# Patient Record
Sex: Female | Born: 1970 | Race: White | Hispanic: No | Smoking: Never smoker
Health system: Southern US, Community
[De-identification: ages and names within clinical notes are randomized; demographics above are authoritative.]

## PROBLEM LIST (undated history)

## (undated) DIAGNOSIS — E039 Hypothyroidism, unspecified: Secondary | ICD-10-CM

## (undated) DIAGNOSIS — O039 Complete or unspecified spontaneous abortion without complication: Secondary | ICD-10-CM

## (undated) DIAGNOSIS — E079 Disorder of thyroid, unspecified: Secondary | ICD-10-CM

## (undated) DIAGNOSIS — J302 Other seasonal allergic rhinitis: Secondary | ICD-10-CM

## (undated) HISTORY — DX: Other seasonal allergic rhinitis: J30.2

## (undated) HISTORY — PX: TONSILLECTOMY: SUR1361

---

## 2006-02-18 ENCOUNTER — Ambulatory Visit: Payer: Self-pay | Admitting: Obstetrics and Gynecology

## 2007-04-22 ENCOUNTER — Ambulatory Visit: Payer: Self-pay | Admitting: Internal Medicine

## 2010-04-14 ENCOUNTER — Emergency Department: Payer: Self-pay | Admitting: Emergency Medicine

## 2010-04-14 ENCOUNTER — Inpatient Hospital Stay: Payer: Self-pay | Admitting: Internal Medicine

## 2010-04-22 ENCOUNTER — Ambulatory Visit: Payer: Self-pay | Admitting: Internal Medicine

## 2010-06-24 ENCOUNTER — Ambulatory Visit: Payer: Self-pay | Admitting: Otolaryngology

## 2010-12-25 ENCOUNTER — Ambulatory Visit: Payer: Self-pay | Admitting: Obstetrics and Gynecology

## 2010-12-26 ENCOUNTER — Ambulatory Visit: Payer: Self-pay | Admitting: Obstetrics and Gynecology

## 2010-12-29 LAB — PATHOLOGY REPORT

## 2011-07-01 ENCOUNTER — Ambulatory Visit: Payer: Self-pay | Admitting: Obstetrics and Gynecology

## 2012-03-02 ENCOUNTER — Ambulatory Visit: Payer: Self-pay | Admitting: Obstetrics and Gynecology

## 2012-11-14 ENCOUNTER — Inpatient Hospital Stay: Payer: Self-pay

## 2012-11-14 LAB — URINALYSIS, COMPLETE
Nitrite: NEGATIVE
Ph: 6 (ref 4.5–8.0)
Protein: NEGATIVE
RBC,UR: 3 /HPF (ref 0–5)
Squamous Epithelial: <1

## 2012-11-14 LAB — CBC WITH DIFFERENTIAL/PLATELET
Basophil #: 0 10*3/uL (ref 0.0–0.1)
Eosinophil %: 0.1 %
Lymphocyte #: 1.6 10*3/uL (ref 1.0–3.6)
Lymphocyte %: 12.7 %
Monocyte #: 0.4 x10 3/mm (ref 0.2–0.9)
Monocyte %: 3.5 %
Neutrophil %: 83.4 %
Platelet: 206 10*3/uL (ref 150–440)

## 2012-11-15 LAB — HEMATOCRIT: HCT: 26.6 % — ABNORMAL LOW (ref 35.0–47.0)

## 2012-11-16 LAB — URINE CULTURE

## 2013-01-23 ENCOUNTER — Ambulatory Visit: Payer: Self-pay | Admitting: Obstetrics and Gynecology

## 2013-01-23 LAB — URINALYSIS, COMPLETE
Bilirubin,UR: NEGATIVE
Ph: 5 (ref 4.5–8.0)
Protein: NEGATIVE
Specific Gravity: 1.018 (ref 1.003–1.030)

## 2013-01-23 LAB — HEMOGLOBIN: HGB: 12.2 g/dL (ref 12.0–16.0)

## 2013-01-24 LAB — PATHOLOGY REPORT

## 2013-03-29 ENCOUNTER — Ambulatory Visit: Payer: Self-pay | Admitting: Podiatry

## 2013-05-30 ENCOUNTER — Ambulatory Visit: Payer: Self-pay | Admitting: Obstetrics and Gynecology

## 2013-06-08 ENCOUNTER — Ambulatory Visit: Payer: Self-pay | Admitting: Obstetrics and Gynecology

## 2013-10-18 DIAGNOSIS — M545 Low back pain, unspecified: Secondary | ICD-10-CM | POA: Insufficient documentation

## 2013-10-18 DIAGNOSIS — R51 Headache: Secondary | ICD-10-CM | POA: Insufficient documentation

## 2013-10-18 DIAGNOSIS — R519 Headache, unspecified: Secondary | ICD-10-CM | POA: Insufficient documentation

## 2013-10-18 DIAGNOSIS — E7800 Pure hypercholesterolemia, unspecified: Secondary | ICD-10-CM | POA: Insufficient documentation

## 2014-01-02 ENCOUNTER — Ambulatory Visit: Payer: Self-pay | Admitting: Obstetrics and Gynecology

## 2014-02-04 DIAGNOSIS — D510 Vitamin B12 deficiency anemia due to intrinsic factor deficiency: Secondary | ICD-10-CM | POA: Insufficient documentation

## 2014-02-04 DIAGNOSIS — G43909 Migraine, unspecified, not intractable, without status migrainosus: Secondary | ICD-10-CM | POA: Insufficient documentation

## 2014-02-04 DIAGNOSIS — Z8659 Personal history of other mental and behavioral disorders: Secondary | ICD-10-CM | POA: Insufficient documentation

## 2014-02-04 DIAGNOSIS — E039 Hypothyroidism, unspecified: Secondary | ICD-10-CM | POA: Insufficient documentation

## 2014-06-13 DIAGNOSIS — J309 Allergic rhinitis, unspecified: Secondary | ICD-10-CM | POA: Insufficient documentation

## 2014-07-17 ENCOUNTER — Ambulatory Visit: Payer: Self-pay | Admitting: Obstetrics and Gynecology

## 2014-11-09 NOTE — Op Note (Signed)
PATIENT NAME:  Tracy Gillespie, [place] MR#:  [id number] DATE OF BIRTH:  [birth year]  DATE OF PROCEDURE:  01/23/2013  PREOPERATIVE DIAGNOSES:  1.  Nine weeks postpartum. 2.  Retained products of conception.   POSTOPERATIVE DIAGNOSES:  1.  Nine weeks postpartum. 2.  Retained products of conception.   PROCEDURE: Suction D and C.   SURGEON: Ricky L. Logan BoresEvans, M.D.   ANESTHESIA: MAC.   FINDINGS: Dilated cervix, degenerating-appearing tissue, probable placental lobule. Excellent hemostasis and cosmesis.   ESTIMATED BLOOD LOSS: Minimal.   COMPLICATIONS: None.   SPECIMENS: Retained products of conception, grossly consistent with placental lobule.   PROCEDURE IN DETAIL: The patient was given 2 grams of Ancef preoperatively, taken to the Operating Room and placed in the supine position. Anesthesia was initiated. She was then placed in the dorsal lithotomy position using Allen stirrups, prepped and draped in the usual sterile fashion. The cervix was visualized. It was already dilated and allowed admission of polyp forceps. Polyp forceps was used to tease free tissue that measured approximately 8 x 1.5 cm, somewhat cylindrical in shape with degenerative changes.   Gentle sharp curettage was carried out, which showed what appeared to be good uterine cry throughout. A #8 suction curette was place with minimal return of fluid. At this point, I did one more gentle scrape with sharp curette, which again showed good uterine cry. At this point, the procedure was felt to have achieved  maximum efficacy. Instruments were removed. The cervix was made hemostatic with silver nitrate. The bladder was drained. The patient tolerated the procedure well.   All instrument, needle and sponge counts were correct. Anticipate routine postoperative course.    ____________________________ Reatha Harpsicky L. Logan BoresEvans, MD rle:jm D: 01/23/2013 10:40:09 ET T: 01/23/2013 14:37:44 ET JOB#: [id number]  cc: Ricky L. Logan BoresEvans, MD, <Dictator> Augustina MoodICK L  Sumaiyah Markert MD ELECTRONICALLY SIGNED 01/25/2013 12:15

## 2014-11-27 NOTE — H&P (Signed)
L&D Evaluation:  History:  HPI 44 y/o G2P0010 @ 38wks EDC 11/28/12 arrives with c/o regular contractions 0310 this am, denies leaking fluid or vaginal bleeding, baby is active.Care @ [facility] well pregnancy, AMA, Hashimotos HX twin pregnancy-no development of one sac/embryo. GBS negative   Presents with contractions   Patient's Medical History Thyroid Disease   Patient's Surgical History none   Medications Pre Natal Vitamins  levothyroxiine 25mcg QD   Allergies NKDA   Social History none   Family History Non-Contributory   ROS:  ROS All systems were reviewed.  HEENT, CNS, GI, GU, Respiratory, CV, Renal and Musculoskeletal systems were found to be normal.   Exam:  Vital Signs stable   Urine Protein not completed   General no apparent distress   Mental Status clear   Chest clear   Heart normal sinus rhythm   Abdomen gravid, non-tender   Estimated Fetal Weight Average for gestational age   Fetal Position vtx   Fundal Height term   Back no CVAT   Edema 1+   Reflexes 2+   Clonus negative   Pelvic no external lesions, 7-8cm 100% vtx @ -1 BBOW   Mebranes Intact   FHT normal rate with no decels, baseline 130s avg variability with accels   Fetal Heart Rate 136   Ucx regular, Q 2/3 mins 60 sec strong   Skin dry   Lymph no lymphadenopathy   Impression:  Impression active labor   Plan:  Plan monitor contractions and for cervical change   Comments Breathing through uc's well, VSS AF FHR reactive. Discussed plan of care, what to expect with first baby. DC pain management options including position changes for natural childbirth, IV stadol and epidural. Pt prefers natural childbirth for now, may request stadol. Husband at bedside, supportive.   Electronic Signatures: Albertina ParrLugiano, Laiah Pouncey B (CNM)  (Signed 28-Apr-14 08:40)  Authored: L&D Evaluation   Last Updated: 28-Apr-14 08:40 by Albertina ParrLugiano, Maor Meckel B (CNM)

## 2015-01-29 ENCOUNTER — Other Ambulatory Visit: Payer: Self-pay | Admitting: Obstetrics and Gynecology

## 2015-01-29 DIAGNOSIS — N979 Female infertility, unspecified: Secondary | ICD-10-CM

## 2015-02-08 ENCOUNTER — Encounter: Payer: Self-pay | Admitting: Obstetrics and Gynecology

## 2015-02-08 ENCOUNTER — Ambulatory Visit
Admission: RE | Admit: 2015-02-08 | Discharge: 2015-02-08 | Disposition: A | Discharge status: Home / Self Care | Payer: BC Managed Care – PPO | Source: Ambulatory Visit | Attending: Obstetrics and Gynecology | Admitting: Obstetrics and Gynecology

## 2015-02-08 DIAGNOSIS — N971 Female infertility of tubal origin: Secondary | ICD-10-CM | POA: Diagnosis not present

## 2015-02-08 DIAGNOSIS — N979 Female infertility, unspecified: Secondary | ICD-10-CM | POA: Diagnosis present

## 2015-02-08 MED ORDER — IOHEXOL 300 MG/ML  SOLN: 50.0000 mL | Freq: Once | INTRAMUSCULAR | Status: AC | PRN

## 2015-02-08 NOTE — Progress Notes (Signed)
Patient ID: Tracy Gillespie, female   DOB: 02/10/1971, 43 y.o.   MRN: [id number]   Hysterosalpingogram Procedure Note  Date of procedure: 02/08/2015   Pre-operative Diagnosis: Infertility  Post-operative Diagnosis: same  Procedure: Hysterosalpingogram  Surgeon: Kenya Shiraishi E. Oveda Dadamo, MD  Assistant(s):  Radiology assistant. The radiologist present for today read the imaging and agreed with findings below.  Anesthesia: None  Estimated Blood Loss: None  Contrast used:  300 Omnipaque         Complications:  None; patient tolerated the procedure well.         Disposition: To home         Condition: stable  Findings: Bilateral fill and spill of the tubes and a normal endometrial contour was noted.  Procedure Details  HSG procedure discussed with the patient.  Risks, complications, alternatives have been reviewed with her and she agrees to proceed.   The patient presented to the radiology lab and was identified as the correct patient and the procedure verified as an HSG. A verbal Time Out was held with all team members present and in agreement.  Speculum was inserted in to the vagina and the cervix visualized.  The cervix was cleaned with betadine solution. The HSG catheter was inserted and the balloon insufflated with approximately 1.5 ml of air.  Patient was then repositioned for fluoroscopy.  A total of 15 ml of contrast was used for the procedure. The patient tolerated the procedure well, no complications.   Bilateral fill and spill of the tubes and a normal endometrial contour was noted.  Results were reviewed with the patient at the time of the procedure. She verbalized understanding.   Gergory Biello, MD 02/08/2015   

## 2015-02-08 NOTE — Op Note (Signed)
Patient ID: Tracy Gillespie, female   DOB: Oct 01, 1970, 44 y.o.   MRN: [id number]   Hysterosalpingogram Procedure Note  Date of procedure: 02/08/2015   Pre-operative Diagnosis: Infertility  Post-operative Diagnosis: same  Procedure: Hysterosalpingogram  Surgeon: Cline Cools, MD  Assistant(s):  Radiology assistant. The radiologist present for today read the imaging and agreed with findings below.  Anesthesia: None  Estimated Blood Loss: None  Contrast used:  300 Omnipaque         Complications:  None; patient tolerated the procedure well.         Disposition: To home         Condition: stable  Findings: Bilateral fill and spill of the tubes and a normal endometrial contour was noted.  Procedure Details  HSG procedure discussed with the patient.  Risks, complications, alternatives have been reviewed with her and she agrees to proceed.   The patient presented to the radiology lab and was identified as the correct patient and the procedure verified as an HSG. A verbal Time Out was held with all team members present and in agreement.  Speculum was inserted in to the vagina and the cervix visualized.  The cervix was cleaned with betadine solution. The HSG catheter was inserted and the balloon insufflated with approximately 1.5 ml of air.  Patient was then repositioned for fluoroscopy.  A total of 15 ml of contrast was used for the procedure. The patient tolerated the procedure well, no complications.   Bilateral fill and spill of the tubes and a normal endometrial contour was noted.  Results were reviewed with the patient at the time of the procedure. She verbalized understanding.   Christeen Douglas, MD 02/08/2015

## 2015-02-08 NOTE — Progress Notes (Signed)
Patient ID: Tracy Gillespie, female   DOB: 1971-02-04, 44 y.o.   MRN: [id number] Please note that the HSG report from earlier today noted two open tubes. However, the right tube was occluded on HSG according to the radiologist report.

## 2015-03-03 ENCOUNTER — Encounter: Payer: Self-pay | Admitting: Emergency Medicine

## 2015-03-03 ENCOUNTER — Ambulatory Visit
Admission: EM | Admit: 2015-03-03 | Discharge: 2015-03-03 | Disposition: A | Discharge status: Home / Self Care | Payer: BC Managed Care – PPO | Attending: Emergency Medicine | Admitting: Emergency Medicine

## 2015-03-03 DIAGNOSIS — N939 Abnormal uterine and vaginal bleeding, unspecified: Secondary | ICD-10-CM | POA: Insufficient documentation

## 2015-03-03 LAB — CBC WITH DIFFERENTIAL/PLATELET
BASOS ABS: 0 10*3/uL (ref 0–0.1)
Basophils Relative: 1 %
Eosinophils Absolute: 0 10*3/uL (ref 0–0.7)
Eosinophils Relative: 0 %
HEMATOCRIT: 38.9 % (ref 35.0–47.0)
Hemoglobin: 13.3 g/dL (ref 12.0–16.0)
Lymphocytes Relative: 42 %
Lymphs Abs: 3.3 10*3/uL (ref 1.0–3.6)
MCH: 30.4 pg (ref 26.0–34.0)
MCHC: 34.1 g/dL (ref 32.0–36.0)
MCV: 89.3 fL (ref 80.0–100.0)
MONOS PCT: 6 %
Monocytes Absolute: 0.4 10*3/uL (ref 0.2–0.9)
NEUTROS PCT: 51 %
Neutro Abs: 4 10*3/uL (ref 1.4–6.5)
Platelets: 244 10*3/uL (ref 150–440)
RBC: 4.36 MIL/uL (ref 3.80–5.20)
RDW: 13.7 % (ref 11.5–14.5)
WBC: 7.8 10*3/uL (ref 3.6–11.0)

## 2015-03-03 LAB — WET PREP, GENITAL
Clue Cells Wet Prep HPF POC: NONE SEEN
Trich, Wet Prep: NONE SEEN
WBC, Wet Prep HPF POC: NONE SEEN
Yeast Wet Prep HPF POC: NONE SEEN

## 2015-03-03 LAB — PREGNANCY, URINE: Preg Test, Ur: NEGATIVE

## 2015-03-03 MED ORDER — NAPROXEN 500 MG PO TABS: 500.0000 mg | ORAL_TABLET | Freq: Two times a day (BID) | ORAL | Status: DC

## 2015-03-03 NOTE — Discharge Instructions (Signed)
Try the Naprosyn. This will help with cramps and also slow down any bleeding. You are not pregnant today. Your wet prep was also negative. You may be starting your period. Go to the ER if you have pain not controlled with the Naprosyn, if you're going through two sanitary pads per hour, chest pain, if you pass out, or other concerns

## 2015-03-03 NOTE — ED Provider Notes (Signed)
HPI  SUBJECTIVE:  Tracy Gillespie is a G3SAB2P1 44 y.o. female who presents with vaginal bleeding described as light brown spotting. She reports lower intermittent, sporadic abdominal cramping, and generalized weakness. No aggravating or alleviating factors. She has not tried anything for this. No nausea, vomiting, fevers, other abdominal pain, back pain. No palpitations, chest pain, shortness of breath, presyncope, syncope. No vaginal discharge, vaginal odor, genital rash. She does not bruise easily.  Is currently taking Clomid,  As she is trying to get pregnant with her husband. He is not having any symptoms. STDs are not a concern today.  Past medical history of hypothyroidism. No history of gonorrhea, chlamydia, herpes, HIV, syphilis, BV, Trichomonas, used infections. No history of PID. No history of coagulopathies, heavy periods. No history of ectopic pregnancies.   , History reviewed. No pertinent past medical history.  Past Surgical History  Procedure Laterality Date  . Tonsillectomy      History reviewed. No pertinent family history.  Social History  Substance Use Topics  . Smoking status: Never Smoker   . Smokeless tobacco: None  . Alcohol Use: No    No current facility-administered medications for this encounter.  Current outpatient prescriptions:  .  azelastine (ASTELIN) 0.1 % nasal spray, Place into both nostrils 2 (two) times daily. Use in each nostril as directed, Disp: , Rfl:  .  fluticasone (FLONASE) 50 MCG/ACT nasal spray, Place into both nostrils daily., Disp: , Rfl:  .  levothyroxine (SYNTHROID, LEVOTHROID) 75 MCG tablet, Take 75 mcg by mouth daily before breakfast., Disp: , Rfl:  .  naproxen (NAPROSYN) 500 MG tablet, Take 1 tablet (500 mg total) by mouth 2 (two) times daily. X 7 days, Disp: 14 tablet, Rfl: 0  No Known Allergies   ROS  As noted in HPI.   Physical Exam  BP 108/53 mmHg  Pulse 54  Temp(Src) 98.2 F (36.8 C) (Oral)  Resp 18  Ht 5\' 5"   (1.651 m)  Wt 155 lb (70.308 kg)  BMI 25.79 kg/m2  SpO2 99%  LMP 01/28/2015 (Exact Date)  Constitutional: Well developed, well nourished, no acute distress Eyes:  EOMI, conjunctiva normal bilaterally HENT: Normocephalic, atraumatic,mucus membranes moist Respiratory: Normal inspiratory effort Cardiovascular: Normal rate regular rhythm no murmurs rubs gallops. Radial pulses 2+ and regular. GI: Soft, nontender, nondistended, normal bowel sounds. GU: Normal external genitalia. Scant brownish vaginal bleeding/discharge, parous os. No CMT. No adnexal tenderness. Uterus smooth, nontender. Chaperone present during exam skin: No rash, skin intact Musculoskeletal: no deformities Neurologic: Alert & oriented x 3, no focal neuro deficits Psychiatric: Speech and behavior appropriate   ED Course   Medications - No data to display  Orders Placed This Encounter  Procedures  . Wet prep, genital    Standing Status: Standing     Number of Occurrences: 1     Standing Expiration Date:   . CBC with Differential    Standing Status: Standing     Number of Occurrences: 1     Standing Expiration Date:   . Pregnancy, urine    Standing Status: Standing     Number of Occurrences: 1     Standing Expiration Date:      Results for orders placed or performed during the hospital encounter of 03/03/15  Wet prep, genital  Result Value Ref Range   Yeast Wet Prep HPF POC NONE SEEN NONE SEEN   Trich, Wet Prep NONE SEEN NONE SEEN   Clue Cells Wet Prep HPF POC NONE SEEN NONE SEEN  WBC, Wet Prep HPF POC NONE SEEN NONE SEEN  CBC with Differential  Result Value Ref Range   WBC 7.8 3.6 - 11.0 K/uL   RBC 4.36 3.80 - 5.20 MIL/uL   Hemoglobin 13.3 12.0 - 16.0 g/dL   HCT 16.1 09.6 - 04.5 %   MCV 89.3 80.0 - 100.0 fL   MCH 30.4 26.0 - 34.0 pg   MCHC 34.1 32.0 - 36.0 g/dL   RDW 40.9 81.1 - 91.4 %   Platelets 244 150 - 440 K/uL   Neutrophils Relative % 51 %   Neutro Abs 4.0 1.4 - 6.5 K/uL   Lymphocytes  Relative 42 %   Lymphs Abs 3.3 1.0 - 3.6 K/uL   Monocytes Relative 6 %   Monocytes Absolute 0.4 0.2 - 0.9 K/uL   Eosinophils Relative 0 %   Eosinophils Absolute 0.0 0 - 0.7 K/uL   Basophils Relative 1 %   Basophils Absolute 0.0 0 - 0.1 K/uL  Pregnancy, urine  Result Value Ref Range   Preg Test, Ur NEGATIVE NEGATIVE     No results found.  ED Clinical Impression  Vaginal bleeding   ED Assessment/Plan  Reviewed labs. Hemoglobin acceptable. Wet prep negative. Patient is not pregnant. Did not test for gonorrhea chlamydia as patient is low risk and feel that testing and treatment would be of limited benefit. Patient had regular pulses, regular heart rate doubt arrhythmia causing her generalized weakness. There is no evidence of stroke.   We'll have patient start on high-dose NSAIDs, follow-up with OB/GYN or primary care physician if vaginal bleeding has not resolved in 4-5 days..   Discussed labs,  MDM, plan and followup with patient . Discussed sn/sx that should prompt return to the UC or ED. Patient agrees with plan.   *This clinic note was created using Dragon dictation software. Therefore, there may be occasional mistakes despite careful proofreading.  ?   Domenick Gong, MD 03/03/15 [phone]

## 2015-03-03 NOTE — ED Notes (Signed)
Pt with vaginal bleeding and cramping x 1 day

## 2015-07-02 ENCOUNTER — Other Ambulatory Visit: Payer: Self-pay | Admitting: Obstetrics and Gynecology

## 2015-07-02 DIAGNOSIS — N6489 Other specified disorders of breast: Secondary | ICD-10-CM

## 2015-08-05 ENCOUNTER — Other Ambulatory Visit: Payer: Self-pay | Admitting: Obstetrics and Gynecology

## 2015-08-05 ENCOUNTER — Ambulatory Visit
Admission: RE | Admit: 2015-08-05 | Discharge: 2015-08-05 | Disposition: A | Discharge status: Home / Self Care | Payer: BC Managed Care – PPO | Source: Ambulatory Visit | Attending: Obstetrics and Gynecology | Admitting: Obstetrics and Gynecology

## 2015-08-05 DIAGNOSIS — N6489 Other specified disorders of breast: Secondary | ICD-10-CM | POA: Diagnosis not present

## 2016-01-27 ENCOUNTER — Ambulatory Visit: Payer: BC Managed Care – PPO | Attending: Orthopedic Surgery | Admitting: Occupational Therapy

## 2016-01-27 DIAGNOSIS — M25521 Pain in right elbow: Secondary | ICD-10-CM

## 2016-01-27 DIAGNOSIS — M6281 Muscle weakness (generalized): Secondary | ICD-10-CM | POA: Diagnosis present

## 2016-01-27 DIAGNOSIS — M25621 Stiffness of right elbow, not elsewhere classified: Secondary | ICD-10-CM | POA: Diagnosis present

## 2016-01-27 NOTE — Therapy (Signed)
[facility] [address] [place], [address] Phone: [phone]   Fax:  [phone]  Occupational Therapy Treatment  Patient Details  Name: Keymani Glynn MRN: [id number] Date of Birth: [birth year] Referring Provider: Lenard Forth   Encounter Date: 01/27/2016      OT End of Session - 01/27/16 1954    Visit Number 1   Number of Visits 12   Date for OT Re-Evaluation 03/09/16   OT Start Time 0900   OT Stop Time 1003   OT Time Calculation (min) 63 min   Activity Tolerance Patient tolerated treatment well   Behavior During Therapy [facility] for tasks assessed/performed      No past medical history on file.  Past Surgical History  Procedure Laterality Date  . Tonsillectomy      There were no vitals filed for this visit.      Subjective Assessment - 01/27/16 0905    Subjective  Started after my son was born 80  - and then flare up in 2016 - and then earlier this year -had 4 shots - last one not working  as good- over summer now better    Patient Stated Goals I do not want surgery - it is already better over  the summe - but still about 5/10 - want to get it better    Currently in Pain? Yes   Pain Score 5    Pain Location Elbow   Pain Orientation Right   Pain Descriptors / Indicators Aching            [facility] OT Assessment - 01/27/16 0001    Assessment   Diagnosis R lateral epidoncyliitis    Referring Provider mundy    Onset Date 12/18/13   Precautions   Required Braces or Orthoses Other Brace/Splint   Home  Environment   Lives With Family   Prior Function   Vocation Full time employment   Leisure play son, read, water work out  ,   Chiropractor Grip (lbs) 44   Right Hand Lateral Pinch 16 lbs   Right Hand 3 Point Pinch 12 lbs   Left Hand Grip (lbs) 56   Left Hand Lateral Pinch 17 lbs   Left Hand 3 Point Pinch 14 lbs       pt ed on HEP and heat , cross friction massage  ed on  modifications  and then ionto  With dexamethazone meds - med patch at 2.0 current and 19 min  Over R lateral epicondyle                     OT Education - 01/27/16 1954    Education provided Yes   Education Details HEP    Person(s) Educated Patient   Methods Explanation;Demonstration;Tactile cues;Verbal cues;Handout   Comprehension Verbal cues required;Returned demonstration;Verbalized understanding          OT Short Term Goals - 01/27/16 1959    OT SHORT TERM GOAL #1   Title Pain on PRTEE improve with at leat 10 points    Baseline 14/50 at eval    Time 3   Period Weeks   Status New   OT SHORT TERM GOAL #2   Title Pt to show decrease stiffness in am and night time and decrease discomfort with stretches of wrist extensors    Baseline stiffness and pain still about 6/10 at the worse    Time 4   Period  Weeks   Status New           OT Long Term Goals - 01/27/16 2000    OT LONG TERM GOAL #1   Title Function improve on PRTEE by at least 10-12 points in using R hand    Baseline at eval 16 .5/50    Time 6   Period Weeks   Status New   OT LONG TERM GOAL #2   Title R grip strength improve at least  5-8 lbs to lift and grabb objects with less than 6/10 pain    Baseline R 44 , L 56 lbs    Time 6   Period Weeks   Status New               Plan - 01/27/16 1955    Clinical Impression Statement Pt present with symptoms of Latera epicondylitis since about 2015 first time after son was born and did swim lessons - since then had about 4 shots - and last one earlier this year and working for [employer] 2 months - - did several brace to elbow ,  found some stuff to do on the internet - pain better but stiffness at night time and am and tenderness - stlll positive for middle finger extnetoin test and wrist extnetion - grip decrease compare to L hand - pt is R hand dominant - 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  - pt ed on HEP , counter force splint use and   modifying  how she pick up, lift , carrying and hold objects    Rehab Potential Good   OT Frequency 2x / week   OT Duration 6 weeks   OT Treatment/Interventions Self-care/ADL training;Moist Heat;Cryotherapy;Iontophoresis;Therapeutic exercise;Patient/family education;Splinting;Manual Therapy   Plan assess improvement in HEP    OT Home Exercise Plan see pt instruction    Consulted and Agree with Plan of Care Patient      Patient will benefit from skilled therapeutic intervention in order to improve the following deficits and impairments:  Decreased range of motion, Impaired flexibility, Pain, Impaired UE functional use, Decreased knowledge of precautions, Decreased strength  Visit Diagnosis: Pain in right elbow - Plan: Ot plan of care cert/re-cert  Stiffness of right elbow, not elsewhere classified - Plan: Ot plan of care cert/re-cert  Muscle weakness (generalized) - Plan: Ot plan of care cert/re-cert    Problem List There are no active problems to display for this patient.   Oletta CohnuPreez, Kelden Lavallee OTR/L,CLT  01/27/2016, 8:07 PM  [facility] [address] [place], [place], [address] Phone: [phone]   Fax:  [phone]  Name: Orpah CobbStephanie Franchino MRN: [id number] Date of Birth: Jan 03, 1971

## 2016-01-27 NOTE — Patient Instructions (Signed)
Pt to do heat , cross friction massage over and around R lat epicondyle AROM with elbow to side for wrist flexion - neutral and in pronation  And then extended elbow - same positions 10 eps each   slight pull  Ice several times during day -around lat epicondyle   Counter force splint use  And ed on modifications of tasks

## 2016-01-31 ENCOUNTER — Ambulatory Visit: Payer: BC Managed Care – PPO | Admitting: Occupational Therapy

## 2016-01-31 DIAGNOSIS — M25621 Stiffness of right elbow, not elsewhere classified: Secondary | ICD-10-CM

## 2016-01-31 DIAGNOSIS — M25521 Pain in right elbow: Secondary | ICD-10-CM

## 2016-01-31 DIAGNOSIS — M6281 Muscle weakness (generalized): Secondary | ICD-10-CM

## 2016-01-31 NOTE — Therapy (Signed)
[facility] [address] [place], [place], [address] Phone: [phone]   Fax:  [phone]  Occupational Therapy Treatment  Patient Details  Name: Orpah CobbStephanie Pacey MRN: [id number] Date of Birth: 1970/12/15 Referring Provider: Lenard Forthmundy   Encounter Date: 01/31/2016      OT End of Session - 01/31/16 1019    Visit Number 2   Number of Visits 12   Date for OT Re-Evaluation 03/09/16   OT Start Time 0932   OT Stop Time 1029   OT Time Calculation (min) 57 min   Activity Tolerance Patient tolerated treatment well   Behavior [facility] for tasks assessed/performed      No past medical history on file.  Past Surgical History  Procedure Laterality Date  . Tonsillectomy      There were no vitals filed for this visit.      Subjective Assessment - 01/31/16 1016    Subjective  I was able to do the first 3 stretches - it feels good after the heat but the last one I could not do - and then still my arm stiff at night - I did bring my elbow splint with me - and I am trying to pick things up with my palm up - I found that me  dryer is up high and   I was pulling it out of it with  my palms down    Patient Stated Goals I do not want surgery - it is already better over  the summe - but still about 5/10 - want to get it better    Currently in Pain? Yes   Pain Score 1    Pain Location Elbow   Pain Orientation Right   Pain Descriptors / Indicators Aching   Pain Type Chronic pain       Heat 10 min over R elbow  Graston tools 2 and 4 done over dorsal forearm - sweeping and scooping - as well as volar forearm  Cross friction massage done over lateral epicondyle  Prior to stretches - with elbow  To side   wrist flexion with forearm in neutral and pronation 10 reps hold 5 sec   then extended arm - same  - need to stop halfway active stretch when feeling pull  Reinforce no pain at home with these - other wise not to do    Ed pt on donning and wearing counter force splint correctly - during activities that cause pain  Review again modifying activities - how she reach and grip objects   Ionto with dexamethasone med patch - over lateral Epicondyle - 2.0 current and 19 min  Pt to keep patch on this date - skin check done at start                         OT Education - 01/31/16 1018    Education provided Yes   Education Details HEP and counter force splint weaing           OT Short Term Goals - 01/27/16 1959    OT SHORT TERM GOAL #1   Title Pain on PRTEE improve with at leat 10 points    Baseline 14/50 at eval    Time 3   Period Weeks   Status New   OT SHORT TERM GOAL #2   Title Pt to show decrease stiffness in am and night time and decrease discomfort with stretches  of wrist extensors    Baseline stiffness and pain still about 6/10 at the worse    Time 4   Period Weeks   Status New           OT Long Term Goals - 01/27/16 2000    OT LONG TERM GOAL #1   Title Function improve on PRTEE by at least 10-12 points in using R hand    Baseline at eval 16 .5/50    Time 6   Period Weeks   Status New   OT LONG TERM GOAL #2   Title R grip strength improve at least  5-8 lbs to lift and grabb objects with less than 6/10 pain    Baseline R 44 , L 56 lbs    Time 6   Period Weeks   Status New               Plan - 01/31/16 1020    Clinical Impression Statement Pt report some decrease tightness or pain during HEP - but stiffenss at night time - fitted and ed pt on use of counter force and HEP - 2nd session of ionto this date    OT Frequency 2x / week   OT Duration 6 weeks   OT Treatment/Interventions Self-care/ADL training;Moist Heat;Cryotherapy;Iontophoresis;Therapeutic exercise;Patient/family education;Splinting;Manual Therapy   Plan cont to assess progess and upgrade EHP    OT Home Exercise Plan see pt instruction    Consulted and Agree with Plan of Care Patient       Patient will benefit from skilled therapeutic intervention in order to improve the following deficits and impairments:  Decreased range of motion, Impaired flexibility, Pain, Impaired UE functional use, Decreased knowledge of precautions, Decreased strength  Visit Diagnosis: Pain in right elbow  Stiffness of right elbow, not elsewhere classified  Muscle weakness (generalized)    Problem List There are no active problems to display for this patient.   Oletta Cohn OTR/L,CLT 01/31/2016, 2:36 PM  [place] [facility] [address], [place], [address] Phone: [phone]   Fax:  [phone]  Name: Taccara Bushnell MRN: [id number] Date of Birth: Apr 13, 1971

## 2016-01-31 NOTE — Patient Instructions (Signed)
Same HEP - but can do stretches more prior to actiivities And counter force with any act like playing with son, laundry .Marland Kitchen.Marland Kitchen..Marland Kitchen

## 2016-02-04 ENCOUNTER — Ambulatory Visit: Payer: BC Managed Care – PPO | Admitting: Occupational Therapy

## 2016-02-04 DIAGNOSIS — M25521 Pain in right elbow: Secondary | ICD-10-CM

## 2016-02-04 DIAGNOSIS — M6281 Muscle weakness (generalized): Secondary | ICD-10-CM

## 2016-02-04 DIAGNOSIS — M25621 Stiffness of right elbow, not elsewhere classified: Secondary | ICD-10-CM

## 2016-02-04 NOTE — Therapy (Signed)
[facility] PHYSICAL AND SPORTS MEDICINE [address] [place], [place], [address] Phone: [phone]   Fax:  [phone]  Occupational Therapy Treatment  Patient Details  Name: Tracy CobbStephanie Gillespie MRN: [id number] Date of Birth: Jun 25, 1971 Referring Provider: Lenard Forthmundy   Encounter Date: 02/04/2016      OT End of Session - 02/04/16 1754    Visit Number 3   Number of Visits 12   Date for OT Re-Evaluation 03/09/16   OT Start Time 1216   OT Stop Time 1305   OT Time Calculation (min) 49 min   Activity Tolerance Patient tolerated treatment well   Behavior During Therapy [facility] for tasks assessed/performed      No past medical history on file.  Past Surgical History  Procedure Laterality Date  . Tonsillectomy      There were no vitals filed for this visit.      Subjective Assessment - 02/04/16 1751    Subjective  During the day my arm feels good - but during night time it is stiff - I did do my stretches prior to doing tasks at home and wore my counterforce brace when I bathed my son    Patient Stated Goals I do not want surgery - it is already better over  the summe - but still about 5/10 - want to get it better    Currently in Pain? Yes   Pain Score 1    Pain Location Elbow   Pain Orientation Right   Pain Descriptors / Indicators Aching   Pain Type Chronic pain      Heat 10 min over R elbow Graston tools 2 and 4 done over dorsal forearm - sweeping and scooping - as well as volar forearm  Cross friction massage done over lateral epicondyle  Prior to stretches - with elbow To side  wrist flexion with forearm in neutral and pronation 10 reps hold 5 sec  then extended arm - same - need to stop halfway active stretch when feeling pull  Reinforce no pain at home with these - other wise not to do    Review again modifying activities - how she reach and grip objects   Ionto with dexamethasone med patch - over lateral Epicondyle -  2.0 current and 19 min  Pt to keep patch on this date - skin check done at start                        OT Education - 02/04/16 1753    Education provided Yes   Education Details Homeprogram -    Person(s) Educated Patient   Methods Explanation;Demonstration;Tactile cues;Verbal cues   Comprehension Verbal cues required;Returned demonstration;Verbalized understanding          OT Short Term Goals - 01/27/16 1959    OT SHORT TERM GOAL #1   Title Pain on PRTEE improve with at leat 10 points    Baseline 14/50 at eval    Time 3   Period Weeks   Status New   OT SHORT TERM GOAL #2   Title Pt to show decrease stiffness in am and night time and decrease discomfort with stretches of wrist extensors    Baseline stiffness and pain still about 6/10 at the worse    Time 4   Period Weeks   Status New           OT Long Term Goals - 01/27/16 2000    OT LONG  TERM GOAL #1   Title Function improve on PRTEE by at least 10-12 points in using R hand    Baseline at eval 16 .5/50    Time 6   Period Weeks   Status New   OT LONG TERM GOAL #2   Title R grip strength improve at least  5-8 lbs to lift and grabb objects with less than 6/10 pain    Baseline R 44 , L 56 lbs    Time 6   Period Weeks   Status New               Plan - 02/04/16 1756    Clinical Impression Statement Pt report increase ROM during day - but stiffness at night time - did had about 1/10 pain at lat epicondyle with MF and wrist extention resistance - 3rd session of ionto   Rehab Potential Good   OT Frequency 2x / week   OT Duration 6 weeks   OT Treatment/Interventions Self-care/ADL training;Moist Heat;Cryotherapy;Iontophoresis;Therapeutic exercise;Patient/family education;Splinting;Manual Therapy   Plan assess progress and upgrade as neded    OT Home Exercise Plan see pt instruction    Consulted and Agree with Plan of Care Patient      Patient will benefit from skilled therapeutic  intervention in order to improve the following deficits and impairments:  Decreased range of motion, Impaired flexibility, Pain, Impaired UE functional use, Decreased knowledge of precautions, Decreased strength  Visit Diagnosis: Pain in right elbow  Stiffness of right elbow, not elsewhere classified  Muscle weakness (generalized)    Problem List There are no active problems to display for this patient.   Oletta Cohn OTR/L,CLT  02/04/2016, 6:00 PM  [place] [facility] PHYSICAL AND SPORTS MEDICINE [address], [place], [address] Phone: [phone]   Fax:  [phone]  Name: Tracy Gillespie MRN: [id number] Date of Birth: 05/15/1971

## 2016-02-07 ENCOUNTER — Ambulatory Visit: Payer: BC Managed Care – PPO | Admitting: Occupational Therapy

## 2016-02-07 DIAGNOSIS — M25621 Stiffness of right elbow, not elsewhere classified: Secondary | ICD-10-CM

## 2016-02-07 DIAGNOSIS — M6281 Muscle weakness (generalized): Secondary | ICD-10-CM

## 2016-02-07 DIAGNOSIS — M25521 Pain in right elbow: Secondary | ICD-10-CM | POA: Diagnosis not present

## 2016-02-07 NOTE — Therapy (Signed)
[facility] PHYSICAL AND SPORTS MEDICINE [address] [place], [address] Phone: [phone]   Fax:  [phone]  Occupational Therapy Treatment  Patient Details  Name: Tracy Gillespie MRN: [id number] Date of Birth: 03/16/1971 Referring Provider: Lenard Gillespie   Encounter Date: 02/07/2016      OT End of Session - 02/07/16 1124    Visit Number 4   Number of Visits 12   Date for OT Re-Evaluation 03/09/16   OT Start Time 0820   OT Stop Time 0925   OT Time Calculation (min) 65 min   Activity Tolerance Patient tolerated treatment well   Behavior During Therapy [facility] for tasks assessed/performed      No past medical history on file.  Past Surgical History  Procedure Laterality Date  . Tonsillectomy      There were no vitals filed for this visit.      Subjective Assessment - 02/07/16 1011    Subjective  I had rough day yesterday - just all type of stuff - but arm better-I want to say the  stiffness is better in the night time - still there but little better - during day still feel some pain - but not really bad    Patient Stated Goals I do not want surgery - it is already better over  the summe - but still about 5/10 - want to get it better    Currently in Pain? Yes   Pain Score 1    Pain Location Elbow   Pain Orientation Right   Pain Descriptors / Indicators Aching        Heat 10 min over R elbow Graston tools 2 and 4 done over dorsal forearm - sweeping and scooping - as well as volar forearm  Cross friction massage done over lateral epicondyle  Prior to stretches - with elbow To side   wrist flexion with forearm in neutral and pronation 10 reps hold 5 sec  then extended arm - same- but hand open - full range - but loose fist  - need to stop halfway active stretch when feeling pull  Reinforce no pain at home with these - other wise not to do    Review again modifying activities - how she reach and grip objects    Ionto with dexamethasone med patch - over lateral Epicondyle - 2.0 current and 19 min  Pt to keep patch on this date - skin check done at start                             OT Education - 02/07/16 1124    Education provided Yes   Education Details HEP update   Person(s) Educated Patient   Methods Explanation;Demonstration;Tactile cues;Verbal cues   Comprehension Verbal cues required;Returned demonstration;Verbalized understanding          OT Short Term Goals - 01/27/16 1959    OT SHORT TERM GOAL #1   Title Pain on PRTEE improve with at leat 10 points    Baseline 14/50 at eval    Time 3   Period Weeks   Status New   OT SHORT TERM GOAL #2   Title Pt to show decrease stiffness in am and night time and decrease discomfort with stretches of wrist extensors    Baseline stiffness and pain still about 6/10 at the worse    Time 4   Period Weeks   Status New  OT Long Term Goals - 01/27/16 2000    OT LONG TERM GOAL #1   Title Function improve on PRTEE by at least 10-12 points in using R hand    Baseline at eval 16 .5/50    Time 6   Period Weeks   Status New   OT LONG TERM GOAL #2   Title R grip strength improve at least  5-8 lbs to lift and grabb objects with less than 6/10 pain    Baseline R 44 , L 56 lbs    Time 6   Period Weeks   Status New               Plan - 02/07/16 1124    Clinical Impression Statement Pt report showing progress in  stiffness in R elbow daytime but some what at night time - pain improving - this date 4th session of ionto -    Rehab Potential Good   OT Frequency 2x / week   OT Duration 6 weeks   OT Treatment/Interventions Self-care/ADL training;Moist Heat;Cryotherapy;Iontophoresis;Therapeutic exercise;Patient/family education;Splinting;Manual Therapy   Plan assess progress and upgrade    OT Home Exercise Plan see pt instruction    Consulted and Agree with Plan of Care Patient      Patient will  benefit from skilled therapeutic intervention in order to improve the following deficits and impairments:  Decreased range of motion, Impaired flexibility, Pain, Impaired UE functional use, Decreased knowledge of precautions, Decreased strength  Visit Diagnosis: Pain in right elbow  Stiffness of right elbow, not elsewhere classified  Muscle weakness (generalized)    Problem List There are no active problems to display for this patient.   Tracy Gillespie, Tracy Gillespie OTR/L,CLT  02/07/2016, 11:27 AM  [facility] PHYSICAL AND SPORTS MEDICINE [address] [place], [place], [address] Phone: [phone]   Fax:  [phone]  Name: Tracy Gillespie MRN: [id number] Date of Birth: 11/05/1970

## 2016-02-07 NOTE — Patient Instructions (Signed)
Same HEP but work on end range for elbow extention

## 2016-02-10 ENCOUNTER — Ambulatory Visit: Payer: BC Managed Care – PPO | Admitting: Occupational Therapy

## 2016-02-10 DIAGNOSIS — M25521 Pain in right elbow: Secondary | ICD-10-CM

## 2016-02-10 DIAGNOSIS — M6281 Muscle weakness (generalized): Secondary | ICD-10-CM

## 2016-02-10 DIAGNOSIS — M25621 Stiffness of right elbow, not elsewhere classified: Secondary | ICD-10-CM

## 2016-02-10 NOTE — Therapy (Signed)
[place] [facility] PHYSICAL AND SPORTS MEDICINE [address] [place], [address] Phone: [phone]   Fax:  [phone]  Occupational Therapy Treatment  Patient Details  Name: Tracy Gillespie MRN: [id number] Date of Birth: [birth year] Referring Provider: Lenard Forth   Encounter Date: 02/10/2016      OT End of Session - 02/10/16 1738    Visit Number 5   Number of Visits 12   Date for OT Re-Evaluation 03/09/16   OT Start Time 1010   OT Stop Time 1055   OT Time Calculation (min) 45 min   Activity Tolerance Patient tolerated treatment well   Behavior During Therapy [facility] for tasks assessed/performed      No past medical history on file.  Past Surgical History:  Procedure Laterality Date  . TONSILLECTOMY      There were no vitals filed for this visit.      Subjective Assessment - 02/10/16 1011    Subjective  Stiffness better - but those exercises is challenging - I can tell I can have some tightness - I  am trying to change the way I need to do things going back to work    Patient Stated Goals I do not want surgery - it is already better over  the summe - but still about 5/10 - want to get it better    Currently in Pain? No/denies                      OT Treatments/Exercises (OP) - 02/10/16 0001      Moist Heat Therapy   Number Minutes Moist Heat 10 Minutes   Moist Heat Location Elbow      Heat 10 min over R elbow Graston tools 2 and 4 done over dorsal forearm - sweeping and scooping - as well as volar forearm  Less tenderness over lateral epicondyle Prior to stretches - with elbow To side   wrist flexion with forearm in neutral and pronation 10 reps hold 5 sec  With  extended arm - but hand open - full range - and  loose fist  - nearly full range  Reinforce no pain at home with these - other wise not to do    Review again modifying activities - how she reach and grip objects   Ionto with dexamethasone med  patch - over lateral Epicondyle - 2.0 current and 19 min  Pt to keep patch on this date - skin check done at start            OT Education - 02/10/16 1738    Education provided Yes   Education Details HEP review   Person(s) Educated Patient   Methods Explanation;Demonstration;Tactile cues;Verbal cues;Handout   Comprehension Verbal cues required;Returned demonstration;Verbalized understanding          OT Short Term Goals - 01/27/16 1959      OT SHORT TERM GOAL #1   Title Pain on PRTEE improve with at leat 10 points    Baseline 14/50 at eval    Time 3   Period Weeks   Status New     OT SHORT TERM GOAL #2   Title Pt to show decrease stiffness in am and night time and decrease discomfort with stretches of wrist extensors    Baseline stiffness and pain still about 6/10 at the worse    Time 4   Period Weeks   Status New  OT Long Term Goals - 01/27/16 2000      OT LONG TERM GOAL #1   Title Function improve on PRTEE by at least 10-12 points in using R hand    Baseline at eval 16 .5/50    Time 6   Period Weeks   Status New     OT LONG TERM GOAL #2   Title R grip strength improve at least  5-8 lbs to lift and grabb objects with less than 6/10 pain    Baseline R 44 , L 56 lbs    Time 6   Period Weeks   Status New               Plan - 02/10/16 1739    Clinical Impression Statement Pt report progress in pain and stiffness at R elbow - feel pull with elbow extention end range - not to push into wall - only touch - cont with HEP and modiftying activities   Rehab Potential Good   OT Frequency 2x / week   OT Duration 6 weeks   OT Treatment/Interventions Self-care/ADL training;Moist Heat;Cryotherapy;Iontophoresis;Therapeutic exercise;Patient/family education;Splinting;Manual Therapy   Plan progress and upgrade HEP as needed   OT Home Exercise Plan see pt instruction    Consulted and Agree with Plan of Care Patient      Patient will benefit from  skilled therapeutic intervention in order to improve the following deficits and impairments:  Decreased range of motion, Impaired flexibility, Pain, Impaired UE functional use, Decreased knowledge of precautions, Decreased strength  Visit Diagnosis: Pain in right elbow  Stiffness of right elbow, not elsewhere classified  Muscle weakness (generalized)    Problem List There are no active problems to display for this patient.   Oletta Cohn 02/10/2016, 5:57 PM  [place] [facility] PHYSICAL AND SPORTS MEDICINE [address] [place], [address] Phone: [phone]   Fax:  [phone]  Name: Tracy Gillespie MRN: [id number] Date of Birth: April 09, 1971

## 2016-02-10 NOTE — Patient Instructions (Signed)
Same HEP - but do not push into wall with elbow extention - only AROM

## 2016-02-13 ENCOUNTER — Ambulatory Visit: Payer: BC Managed Care – PPO | Admitting: Occupational Therapy

## 2016-02-13 DIAGNOSIS — M6281 Muscle weakness (generalized): Secondary | ICD-10-CM

## 2016-02-13 DIAGNOSIS — M25621 Stiffness of right elbow, not elsewhere classified: Secondary | ICD-10-CM

## 2016-02-13 DIAGNOSIS — M25521 Pain in right elbow: Secondary | ICD-10-CM | POA: Diagnosis not present

## 2016-02-13 NOTE — Patient Instructions (Signed)
Same but add some PROM for stretch with extended arm

## 2016-02-13 NOTE — Therapy (Signed)
[place] [facility] PHYSICAL AND SPORTS MEDICINE [address], [place], [address] Phone: [phone]   Fax:  [phone]  Occupational Therapy Treatment  Patient Details  Name: Tracy Gillespie MRN: [id number] Date of Birth: [birth year] Referring Provider: Lenard Forth   Encounter Date: 02/13/2016      OT End of Session - 02/13/16 1748    Visit Number 6   Number of Visits 12   Date for OT Re-Evaluation 03/09/16   OT Start Time 1740   OT Stop Time 1824   OT Time Calculation (min) 44 min   Activity Tolerance Patient tolerated treatment well   Behavior During Therapy [facility] for tasks assessed/performed      No past medical history on file.  Past Surgical History:  Procedure Laterality Date  . TONSILLECTOMY      There were no vitals filed for this visit.      Subjective Assessment - 02/13/16 1745    Subjective  Stiffness better -not to much last night - doing okay , did more stretches yesterday - and felt really good - very little tenderness   Patient Stated Goals I do not want surgery - it is already better over  the summe - but still about 5/10 - want to get it better    Currently in Pain? No/denies                      OT Treatments/Exercises (OP) - 02/13/16 0001      Moist Heat Therapy   Number Minutes Moist Heat 10 Minutes   Moist Heat Location Elbow      Heat 10 min over R elbow Graston tools 2 and 4 done over dorsal forearm - sweeping and scooping - as well as volar forearm - and medial to lateral epicondyle -but improve from last time -    wrist flexion with forearm in neutral and pronation 10 reps hold 5 sec  With  extended arm -close hand -  full range  - add stretch to it with other and for HEP  Reinforce no pain at home with these - other wise not to do    Review again modifying activities - how she reach and grip objects - lifting and going back to work   [employer] with dexamethasone med patch - over  lateral Epicondyle - 2.0 current and 19 min  Pt to keep patch on this date for hour  - skin check done at start           OT Education - 02/13/16 1747    Education Details HEP review   Person(s) Educated Patient   Methods Explanation;Demonstration;Tactile cues;Verbal cues   Comprehension Returned demonstration;Verbalized understanding;Verbal cues required          OT Short Term Goals - 01/27/16 1959      OT SHORT TERM GOAL #1   Title Pain on PRTEE improve with at leat 10 points    Baseline 14/50 at eval    Time 3   Period Weeks   Status New     OT SHORT TERM GOAL #2   Title Pt to show decrease stiffness in am and night time and decrease discomfort with stretches of wrist extensors    Baseline stiffness and pain still about 6/10 at the worse    Time 4   Period Weeks   Status New           OT Long Term Goals - 01/27/16 2000  OT LONG TERM GOAL #1   Title Function improve on PRTEE by at least 10-12 points in using R hand    Baseline at eval 16 .5/50    Time 6   Period Weeks   Status New     OT LONG TERM GOAL #2   Title R grip strength improve at least  5-8 lbs to lift and grabb objects with less than 6/10 pain    Baseline R 44 , L 56 lbs    Time 6   Period Weeks   Status New               Plan - 02/13/16 1749    Clinical Impression Statement Pt cont to make progress in pain, stiffness and ROM during stretches - small pull with MF resistance test - but more medial than close to lateral epicondyle    Rehab Potential Good   OT Frequency 2x / week   OT Duration 6 weeks   OT Treatment/Interventions Self-care/ADL training;Moist Heat;Cryotherapy;Iontophoresis;Therapeutic exercise;Patient/family education;Splinting;Manual Therapy   Plan assess improvement -    OT Home Exercise Plan see pt instruction    Consulted and Agree with Plan of Care Patient      Patient will benefit from skilled therapeutic intervention in order to improve the following  deficits and impairments:  Decreased range of motion, Impaired flexibility, Pain, Impaired UE functional use, Decreased knowledge of precautions, Decreased strength  Visit Diagnosis: Pain in right elbow  Stiffness of right elbow, not elsewhere classified  Muscle weakness (generalized)    Problem List There are no active problems to display for this patient.   Oletta Cohn OTR/L,CLT  02/13/2016, 7:35 PM  [place] [facility] PHYSICAL AND SPORTS MEDICINE [address] [place], [address] Phone: [phone]   Fax:  [phone]  Name: Tracy Gillespie MRN: [id number] Date of Birth: [birth year]

## 2016-02-17 ENCOUNTER — Ambulatory Visit: Payer: BC Managed Care – PPO | Admitting: Occupational Therapy

## 2016-02-17 DIAGNOSIS — M25521 Pain in right elbow: Secondary | ICD-10-CM | POA: Diagnosis not present

## 2016-02-17 DIAGNOSIS — M6281 Muscle weakness (generalized): Secondary | ICD-10-CM

## 2016-02-17 DIAGNOSIS — M25621 Stiffness of right elbow, not elsewhere classified: Secondary | ICD-10-CM

## 2016-02-17 NOTE — Patient Instructions (Signed)
Same - ed on HEP stretches and modifying activities

## 2016-02-17 NOTE — Therapy (Signed)
[place] [facility] PHYSICAL AND SPORTS MEDICINE [address] [place], [address] Phone: [phone]   Fax:  [phone]  Occupational Therapy Treatment  Patient Details  Name: Tracy Gillespie MRN: [id number] Date of Birth: [birth year] Referring Provider: Lenard Forth   Encounter Date: 02/17/2016      OT End of Session - 02/17/16 0753    Visit Number 7   Number of Visits 12   Date for OT Re-Evaluation 03/09/16   OT Start Time 0717   Activity Tolerance Patient tolerated treatment well   Behavior During Therapy [facility] for tasks assessed/performed      No past medical history on file.  Past Surgical History:  Procedure Laterality Date  . TONSILLECTOMY      There were no vitals filed for this visit.      Subjective Assessment - 02/17/16 0725    Subjective  Stiffness feels better - I do stretch when I feel it little - I wish what I did - or what    Patient Stated Goals I do not want surgery - it is already better over  the summe - but still about 5/10 - want to get it better    Currently in Pain? No/denies            [facility] OT Assessment - 02/17/16 0001      Strength   Right Hand Grip (lbs) 47   Right Hand Lateral Pinch 18 lbs   Right Hand 3 Point Pinch 15 lbs   Left Hand Grip (lbs) 56   Left Hand Lateral Pinch 17 lbs   Left Hand 3 Point Pinch 15 lbs        Heat 10 min over R elbow Graston tools 2 and 4 done over dorsal forearm - sweeping and scooping - as well as volar forearm - and medial to lateral epicondyle some tightness and tenderness Joint mobs to proximal ulnar head - WNL - no pain     wrist flexion with forearm in neutral and pronation 10 reps hold 5 sec With extended arm -close hand -  full range  - add stretch to it with other hand   Reinforce no pain at home with these - other wise not to do  No pain with wrist extention and middle finger extention resistance Attempted grip with teal putty- but felt little  pull  Hold off    Review again modifying activities - how she reach and grip objects - lifting and going back to work   [employer] with dexamethasone med patch - over lateral Epicondyle - 2.0 current and 19 min  Pt to keep patch on this date for hour  - skin check done                    OT Education - 02/17/16 0753    Education provided Yes   Education Details HEP and modications of tasks    Person(s) Educated Patient   Methods Explanation;Demonstration;Tactile cues   Comprehension Returned demonstration;Verbalized understanding          OT Short Term Goals - 02/17/16 0755      OT SHORT TERM GOAL #1   Title Pain on PRTEE improve with at leat 10 points    Baseline 14/50 at eval - improving   Time 3   Period Weeks   Status On-going     OT SHORT TERM GOAL #2   Title Pt to show decrease stiffness in am and night time  and decrease discomfort with stretches of wrist extensors    Baseline no pain - stiffness improving   Time 4   Period Weeks   Status On-going           OT Long Term Goals - 02/17/16 0756      OT LONG TERM GOAL #1   Title Function improve on PRTEE by at least 10-12 points in using R hand    Baseline at eval 16 .5/50 - improving   Time 4   Period Weeks   Status On-going     OT LONG TERM GOAL #2   Title R grip strength improve at least  5-8 lbs to lift and grabb objects with less than 6/10 pain    Baseline R 47 and L 56 this date   Time 4   Period Weeks   Status On-going               Plan - 02/17/16 0754    Clinical Impression Statement Pt cont to make progress in stiffness and pain - flexibility WNL - very little tenderness at lateral epicondyle - negative for wrist and MF extention - no pain and good joint mobs on  head of ulna    Rehab Potential Good   OT Frequency 2x / week   OT Duration 4 weeks   OT Treatment/Interventions Self-care/ADL training;Moist Heat;Cryotherapy;Iontophoresis;Therapeutic exercise;Patient/family  education;Splinting;Manual Therapy   Plan check in week    OT Home Exercise Plan see pt instruction    Consulted and Agree with Plan of Care Patient      Patient will benefit from skilled therapeutic intervention in order to improve the following deficits and impairments:  Decreased range of motion, Impaired flexibility, Pain, Impaired UE functional use, Decreased knowledge of precautions, Decreased strength  Visit Diagnosis: Pain in right elbow  Stiffness of right elbow, not elsewhere classified  Muscle weakness (generalized)    Problem List There are no active problems to display for this patient.   Oletta Cohn OTR/L,CLT  02/17/2016, 7:58 AM  [facility] PHYSICAL AND SPORTS MEDICINE [address] [place], [address] Phone: [phone]   Fax:  [phone]  Name: Amarra Sawyer MRN: [id number] Date of Birth: Apr 05, 1971

## 2016-02-25 ENCOUNTER — Ambulatory Visit: Payer: BC Managed Care – PPO | Attending: Orthopedic Surgery | Admitting: Occupational Therapy

## 2016-02-25 DIAGNOSIS — M6281 Muscle weakness (generalized): Secondary | ICD-10-CM | POA: Diagnosis present

## 2016-02-25 DIAGNOSIS — M25521 Pain in right elbow: Secondary | ICD-10-CM | POA: Insufficient documentation

## 2016-02-25 DIAGNOSIS — M25621 Stiffness of right elbow, not elsewhere classified: Secondary | ICD-10-CM | POA: Diagnosis present

## 2016-02-25 NOTE — Patient Instructions (Signed)
Same as last time - and go 2 wks out  -after  back to school and will see few days into starting school  Modifications of activities

## 2016-02-25 NOTE — Therapy (Signed)
[facility] PH[facility] AND [facility] MEDICINE [address] [place], [place], [address] Phone: [phone]   Fax:  [phone]  Occupational Therapy Treatment  Patient Details  Name: Tracy CobbStephanie Gillespie MRN: [id number] Date of Birth: 11/23/1970 Referring Provider: Lenard Forthmundy   Encounter Date: 02/25/2016      OT End of Session - 02/25/16 1611    Visit Number 8   Number of Visits 12   Date for OT Re-Evaluation 03/09/16   OT Start Time 1047   OT Stop Time 1122   OT Time Calculation (min) 35 min   Activity Tolerance Patient tolerated treatment well   Behavior During Therapy [facility] for tasks assessed/performed      No past medical history on file.  Past Surgical History:  Procedure Laterality Date  . TONSILLECTOMY      There were no vitals filed for this visit.      Subjective Assessment - 02/25/16 1558    Subjective  It feels better - I found myself not holding and hitting my elbow anymore- and using it more automatic- did pick up my son few times since last time - and was okay - here and there I do feel still little twinch or stiffness  - but when doing my stretches it is better    Patient Stated Goals I do not want surgery - it is already better over  the summe - but still about 5/10 - want to get it better    Currently in Pain? No/denies            [facility] - 02/25/16 0001      Strength   Right Hand Grip (lbs) 60   Right Hand Lateral Pinch 18 lbs   Right Hand 3 Point Pinch 13 lbs   Left Hand Grip (lbs) 58   Left Hand Lateral Pinch 17 lbs            MEasurements taken for grip and prehension - ROM at wrist  Middle finger extenion with resistance no pain  M/M for wrist in all planes - 5/5      OT Treatments/Exercises (OP) - 02/25/16 0001      Moist Heat Therapy   Number Minutes Moist Heat 10 Minutes   Moist Heat Location Elbow      Graston tools 2 and 4 done over dorsal forearm - sweeping and scooping - as  well as volar forearm - and medial to lateral epicondyle some tenderness right at lateral epicondyle  Joint mobs to proximal ulnar head - WNL - no pain       Review with pt  modifying activities - how she reach and grip objects - lifting and going back to work  On the 21st Aug           OT Education - 02/25/16 1611    Education provided Yes   Education Details modifications of tasks   Person(s) Educated Patient   Methods Explanation;Demonstration;Tactile cues;Verbal cues   Comprehension Returned demonstration;Verbalized understanding          OT Short Term Goals - 02/17/16 0755      OT SHORT TERM GOAL #1   Title Pain on PRTEE improve with at leat 10 points    Baseline 14/50 at eval - improving   Time 3   Period Weeks   Status On-going     OT SHORT TERM GOAL #2   Title Pt to show decrease stiffness in am and night time and  decrease discomfort with stretches of wrist extensors    Baseline no pain - stiffness improving   Time 4   Period Weeks   Status On-going           OT Long Term Goals - 02/17/16 0756      OT LONG TERM GOAL #1   Title Function improve on PRTEE by at least 10-12 points in using R hand    Baseline at eval 16 .5/50 - improving   Time 4   Period Weeks   Status On-going     OT LONG TERM GOAL #2   Title R grip strength improve at least  5-8 lbs to lift and grabb objects with less than 6/10 pain    Baseline R 47 and L 56 this date   Time 4   Period Weeks   Status On-going               Plan - 02/25/16 1612    Clinical Impression Statement Pt cont to make progress in stiffness ,pain , increase ues and flexibility - as well as increase grip strength this date - pt to cont at home and return to school in 2 wks and will check on her  after back to school activities for few days    Rehab Potential Good   OT Frequency 2x / week   OT Duration 4 weeks   OT Treatment/Interventions Self-care/ADL training;Moist  Heat;Cryotherapy;Iontophoresis;Therapeutic exercise;Patient/family education;Splinting;Manual Therapy   Plan reassess in 2 wks    OT Home Exercise Plan see pt instruction    Consulted and Agree with Plan of Care Patient      Patient will benefit from skilled therapeutic intervention in order to improve the following deficits and impairments:  Decreased range of motion, Impaired flexibility, Pain, Impaired UE functional use, Decreased knowledge of precautions, Decreased strength  Visit Diagnosis: Pain in right elbow  Stiffness of right elbow, not elsewhere classified  Muscle weakness (generalized)    Problem List There are no active problems to display for this patient.   Oletta Cohn OTR/L,CLT  02/25/2016, 4:15 PM  [place] [facility] PHYSICAL AND SPORTS MEDICINE [address] [place], [address] Phone: [phone]   Fax:  [phone]  Name: Tracy Gillespie MRN: [id number] Date of Birth: Nov 16, 1970

## 2016-02-26 DIAGNOSIS — Z0000 Encounter for general adult medical examination without abnormal findings: Secondary | ICD-10-CM | POA: Insufficient documentation

## 2016-02-27 ENCOUNTER — Encounter: Payer: BC Managed Care – PPO | Admitting: Occupational Therapy

## 2016-03-13 ENCOUNTER — Ambulatory Visit: Payer: BC Managed Care – PPO | Admitting: Occupational Therapy

## 2016-03-13 DIAGNOSIS — M25521 Pain in right elbow: Secondary | ICD-10-CM | POA: Diagnosis not present

## 2016-03-13 DIAGNOSIS — M6281 Muscle weakness (generalized): Secondary | ICD-10-CM

## 2016-03-13 DIAGNOSIS — M25621 Stiffness of right elbow, not elsewhere classified: Secondary | ICD-10-CM

## 2016-03-13 NOTE — Therapy (Signed)
[place] [facility] MEDICINE [address] [place], [address] Phone: [phone]   Fax:  [phone]  Occupational Therapy Treatment  Patient Details  Name: Tracy Gillespie MRN: [id number] Date of Birth: May 03, 1971 Referring Provider: Lenard Forth   Encounter Date: 03/13/2016      OT End of Session - 03/13/16 1203    Visit Number 9   Number of Visits 12   Date for OT Re-Evaluation 04/24/16   OT Start Time 1130   OT Stop Time 1156   OT Time Calculation (min) 26 min   Activity Tolerance Patient tolerated treatment well   Behavior During Therapy [facility] for tasks assessed/performed      No past medical history on file.  Past Surgical History:  Procedure Laterality Date  . TONSILLECTOMY      There were no vitals filed for this visit.      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 No/denies            [facility] OT Assessment - 03/13/16 0001      Strength   Right Hand Grip (lbs) 55  extended arm 45   Right Hand Lateral Pinch 20 lbs   Right Hand 3 Point Pinch 14 lbs   Left Hand Grip (lbs) 60  extended arm 65   Left Hand Lateral Pinch 19 lbs   Left Hand 3 Point Pinch 16 lbs       Cont with modifications - and cont back to work 3 wks and will reassess if need to add some strengthening   Grip and prehension assess close to body and extended arm see flowsheet  M/M assess - 5/5 for shoulders and elbow all planes Wrist all planes 5/5 except  wrist extention extended arm  4/5   Reviewed again modifications  And HEP for if feels pain or stiffness                    OT Education - 03/13/16 1202    Education provided Yes   Education Details HEP and modifications /findings    Person(s) Educated Patient   Methods Explanation;Demonstration;Tactile cues;Verbal cues   Comprehension Verbal cues required;Returned demonstration;Verbalized understanding          OT Short Term Goals - 03/13/16 1205      OT SHORT TERM GOAL #1   Title Pain on PRTEE improve with at leat 10 points    Status Achieved     OT SHORT TERM GOAL #2   Title Pt to show decrease stiffness in am and night time and decrease discomfort with stretches of wrist extensors    Status Achieved           OT Long Term Goals - 03/13/16 1206      OT LONG TERM GOAL #1   Title Function improve on PRTEE by at least 10-12 points in using  R hand    Status Achieved     OT LONG TERM GOAL #2   Title R grip strength improve at least  5-8 lbs to lift and grabb objects with less than 6/10 pain    Baseline grip decrease from 60 to 50 - and with extended arm 45 R , L 65    Time 6   Period Weeks   Status On-going               Plan - 03/13/16 1203    Clinical Impression Statement Pt cont to have no pain - and flexibility still WNL - pt do show decrease grip with extended arm , and wrist extention - pt return to school this week - and will cont for 3 wks and will reassess her at that time - if still weaker - will do some strengthening  for forearm    Rehab Potential Good   OT Frequency Biweekly   OT Duration 6 weeks   OT Treatment/Interventions Self-care/ADL training;Moist Heat;Cryotherapy;Iontophoresis;Therapeutic exercise;Patient/family education;Splinting;Manual Therapy   Plan reassess in 3 wks - and if needed initiated strenghtening for forearm    OT Home Exercise Plan see pt instruction    Consulted and Agree with Plan of Care Patient      Patient will benefit from  skilled therapeutic intervention in order to improve the following deficits and impairments:  Decreased range of motion, Impaired flexibility, Pain, Impaired UE functional use, Decreased knowledge of precautions, Decreased strength  Visit Diagnosis: Pain in right elbow - Plan: Ot plan of care cert/re-cert  Stiffness of right elbow, not elsewhere classified - Plan: Ot plan of care cert/re-cert  Muscle weakness (generalized) - Plan: Ot plan of care cert/re-cert    Problem List There are no active problems to display for this patient.   Oletta CohnuPreez, Oceana Walthall OTR/L,CLT  03/13/2016, 12:09 PM  [place] [facility]IC[facility] [address] [place], [place], [address] Phone: [phone]   Fax:  [phone]  Name: Orpah CobbStephanie Vanessen MRN: [id number] Date of Birth: [birth year]

## 2016-04-02 ENCOUNTER — Ambulatory Visit: Payer: BC Managed Care – PPO | Attending: Orthopedic Surgery | Admitting: Occupational Therapy

## 2016-04-02 DIAGNOSIS — M25621 Stiffness of right elbow, not elsewhere classified: Secondary | ICD-10-CM | POA: Diagnosis present

## 2016-04-02 DIAGNOSIS — M25521 Pain in right elbow: Secondary | ICD-10-CM | POA: Insufficient documentation

## 2016-04-02 DIAGNOSIS — M6281 Muscle weakness (generalized): Secondary | ICD-10-CM | POA: Insufficient documentation

## 2016-04-02 NOTE — Therapy (Signed)
[facility] [address] [place], [facility] [address] Phone: [phone]   Fax:  [phone]  Occupational Therapy Treatment  Patient Details  Name: Tracy CobbStephanie Gillespie MRN: [id number] Date of Birth: 08-Sep-1970 Referring Provider: Lenard Forthmundy   Encounter Date: 04/02/2016      OT End of Session - 04/02/16 1721    Visit Number 10   Number of Visits 12   Date for OT Re-Evaluation 04/24/16   OT Start Time 1540   OT Stop Time 1611   OT Time Calculation (min) 31 min   Activity Tolerance Patient tolerated treatment well   Behavior During Therapy [facility] for tasks assessed/performed      No past medical history on file.  Past Surgical History:  Procedure Laterality Date  . TONSILLECTOMY      There were no vitals filed for this visit.      Subjective Assessment - 04/02/16 1719    Subjective  It is good - not feeling any pain - except some stiffness in the am but I just do some stretches in the am , before school and then during school if I remember and end of day - using it more - still feel like weak reaching out and pick up objects with palm down - but then I just use L hand   Patient Stated Goals I do not want surgery - it is already better over  the summe - but still about 5/10 - want to get it better    Currently in Pain? No/denies      Assess  grip to side and extended arm - reassess - see flow sheet No pain and positive test for 3rd digit and wrist extention  Some what tender at lateral epicondyle - but could be because of 3 shots in the past  Strength for wrist extention in all planes 5/5 Elbow flexion and extention 5/5 in in 3 positions - no pain  Discuss with pt HEP to cont with and modifications                          OT Education - 04/02/16 1720    Education provided Yes   Education Details modifications dicharge instructions    Person(s) Educated Patient   Methods  Explanation;Demonstration   Comprehension Returned demonstration;Verbalized understanding          OT Short Term Goals - 03/13/16 1205      OT SHORT TERM GOAL #1   Title Pain on PRTEE improve with at leat 10 points    Status Achieved     OT SHORT TERM GOAL #2   Title Pt to show decrease stiffness in am and night time and decrease discomfort with stretches of wrist extensors    Status Achieved           OT Long Term Goals - 03/13/16 1206      OT LONG TERM GOAL #1   Title Function improve on PRTEE by at least 10-12 points in using R hand    Status Achieved     OT LONG TERM GOAL #2   Title R grip strength improve at least  5-8 lbs to lift and grabb objects with less than 6/10 pain    Baseline grip decrease from 60 to 50 - and with extended arm 45 R , L 65    Time 6   Period Weeks   Status On-going  Plan - 04/02/16 1722    Clinical Impression Statement Pt cont to show no pain with being back at work - grip with extended arm on R now the same than  to side - and wrist and elbow strength 5/5 - grip still decrease compare to L - but not pain and she is using it more - pt to come by in 5 wks and will reassess grip    Rehab Potential Good   OT Frequency Monthly   OT Duration 4 weeks   OT Treatment/Interventions Self-care/ADL training;Moist Heat;Cryotherapy;Iontophoresis;Therapeutic exercise;Patient/family education;Splinting;Manual Therapy   Plan cont with stretches and modifications for 5 wks   OT Home Exercise Plan see pt instruction    Consulted and Agree with Plan of Care Patient      Patient will benefit from skilled therapeutic intervention in order to improve the following deficits and impairments:  Decreased range of motion, Impaired flexibility, Pain, Impaired UE functional use, Decreased knowledge of precautions, Decreased strength  Visit Diagnosis: Pain in right elbow  Stiffness of right elbow, not elsewhere classified  Muscle weakness  (generalized)    Problem List There are no active problems to display for this patient.   Oletta Cohn OTR/L,CLT  04/02/2016, 5:25 PM  [place] [facility] PHYSICAL [facility] SPORTS MEDICINE [address] [place], [address] Phone: [phone]   Fax:  [phone]  Name: Tracy Gillespie MRN: [id number] Date of Birth: 1970/10/29

## 2016-04-02 NOTE — Patient Instructions (Signed)
Cont with stretches for forearm , and modifications

## 2016-07-06 ENCOUNTER — Other Ambulatory Visit: Payer: Self-pay | Admitting: Obstetrics and Gynecology

## 2016-07-06 DIAGNOSIS — Z1231 Encounter for screening mammogram for malignant neoplasm of breast: Secondary | ICD-10-CM

## 2016-08-06 ENCOUNTER — Ambulatory Visit: Payer: BC Managed Care – PPO

## 2016-09-03 ENCOUNTER — Ambulatory Visit: Payer: Self-pay | Attending: Orthopedic Surgery | Admitting: Occupational Therapy

## 2016-09-03 DIAGNOSIS — M25621 Stiffness of right elbow, not elsewhere classified: Secondary | ICD-10-CM | POA: Insufficient documentation

## 2016-09-03 DIAGNOSIS — M6281 Muscle weakness (generalized): Secondary | ICD-10-CM | POA: Insufficient documentation

## 2016-09-03 DIAGNOSIS — M25521 Pain in right elbow: Secondary | ICD-10-CM | POA: Insufficient documentation

## 2016-09-03 NOTE — Therapy (Signed)
[facility] MEDICINE [address] [place], [place], [address] Phone: [phone]   Fax:  [phone]  Occupational Therapy screen  Patient Details  Name: Tracy CobbStephanie Speagle MRN: [id number] Date of Birth: 10/29/1970 Referring Provider: Lenard Forthmundy   Encounter Date: 09/03/2016    No past medical history on file.  Past Surgical History:  Procedure Laterality Date  . TONSILLECTOMY      There were no vitals filed for this visit.          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  Pt made great progress compare to last time in grip and prehension  No pain some stiffness at elbow maybe if laying or sleeping on it  Strength increase at elbow and wrist 5/5 with elbow to side and extended arm - no pain and 5/5  Pt do report she is pregnant and little afraid symptoms will come back - pt to check back in May or June when school is out - and 2-3 months prior to delivery                        OT Short Term Goals - 03/13/16 1205      OT SHORT TERM GOAL #1   Title Pain on PRTEE improve with at leat 10 points    Status Achieved     OT SHORT TERM GOAL #2   Title Pt to show decrease stiffness in am and night time and decrease discomfort with stretches of wrist extensors    Status Achieved           OT Long Term Goals - 03/13/16 1206      OT LONG TERM GOAL #1   Title Function improve on PRTEE by at least 10-12 points in using R hand    Status Achieved     OT LONG TERM GOAL #2   Title R grip strength improve at least  5-8 lbs to lift and grabb objects with less than 6/10 pain    Baseline grip  decrease from 60 to 50 - and with extended arm 45 R , L 65    Time 6   Period Weeks   Status On-going             Patient will benefit from skilled therapeutic intervention in order to improve the following deficits and impairments:     Visit Diagnosis: Pain in right elbow  Stiffness of right elbow, not elsewhere classified  Muscle weakness (generalized)    Problem List There are no active problems to display for this patient.   Oletta CohnuPreez, Kaytelyn Glore OTR/L,CLT 09/03/2016, 6:02 PM  [place] [facility] MEDICINE [address] [place], [place], [address] Phone: [phone]   Fax:  [phone]  Name: Tracy CobbStephanie Dubreuil MRN: [id number] Date of Birth: 10/29/1970

## 2016-09-04 ENCOUNTER — Ambulatory Visit: Payer: BC Managed Care – PPO

## 2016-12-09 ENCOUNTER — Encounter
Admission: EM | Disposition: A | Discharge status: Home / Self Care | Payer: Self-pay | Source: Home / Self Care | Attending: Emergency Medicine

## 2016-12-09 ENCOUNTER — Emergency Department: Payer: BC Managed Care – PPO | Admitting: Anesthesiology

## 2016-12-09 ENCOUNTER — Emergency Department
Admission: EM | Admit: 2016-12-09 | Discharge: 2016-12-09 | Disposition: A | Discharge status: Home / Self Care | Payer: BC Managed Care – PPO | Attending: Emergency Medicine | Admitting: Emergency Medicine

## 2016-12-09 ENCOUNTER — Emergency Department: Payer: BC Managed Care – PPO

## 2016-12-09 DIAGNOSIS — F419 Anxiety disorder, unspecified: Secondary | ICD-10-CM | POA: Insufficient documentation

## 2016-12-09 DIAGNOSIS — O031 Delayed or excessive hemorrhage following incomplete spontaneous abortion: Secondary | ICD-10-CM | POA: Diagnosis not present

## 2016-12-09 DIAGNOSIS — E079 Disorder of thyroid, unspecified: Secondary | ICD-10-CM | POA: Diagnosis not present

## 2016-12-09 DIAGNOSIS — N938 Other specified abnormal uterine and vaginal bleeding: Secondary | ICD-10-CM

## 2016-12-09 HISTORY — DX: Complete or unspecified spontaneous abortion without complication: O03.9

## 2016-12-09 HISTORY — PX: DILATION AND EVACUATION: SHX1459

## 2016-12-09 HISTORY — DX: Disorder of thyroid, unspecified: E07.9

## 2016-12-09 LAB — CBC WITH DIFFERENTIAL/PLATELET
BASOS ABS: 0 10*3/uL (ref 0–0.1)
Basophils Relative: 1 %
Eosinophils Absolute: 0.1 10*3/uL (ref 0–0.7)
Eosinophils Relative: 1 %
HCT: 35.7 % (ref 35.0–47.0)
Hemoglobin: 12.1 g/dL (ref 12.0–16.0)
Lymphocytes Relative: 37 %
Lymphs Abs: 3 10*3/uL (ref 1.0–3.6)
MCH: 29.7 pg (ref 26.0–34.0)
MCHC: 33.9 g/dL (ref 32.0–36.0)
MCV: 87.6 fL (ref 80.0–100.0)
MONO ABS: 0.5 10*3/uL (ref 0.2–0.9)
Monocytes Relative: 6 %
Neutro Abs: 4.6 10*3/uL (ref 1.4–6.5)
Neutrophils Relative %: 55 %
Platelets: 292 10*3/uL (ref 150–440)
RBC: 4.07 MIL/uL (ref 3.80–5.20)
RDW: 13.8 % (ref 11.5–14.5)
WBC: 8.2 10*3/uL (ref 3.6–11.0)

## 2016-12-09 LAB — COMPREHENSIVE METABOLIC PANEL
ALT: 14 U/L (ref 14–54)
AST: 18 U/L (ref 15–41)
Albumin: 4.2 g/dL (ref 3.5–5.0)
Alkaline Phosphatase: 49 U/L (ref 38–126)
Anion gap: 6 (ref 5–15)
BUN: 9 mg/dL (ref 6–20)
CO2: 27 mmol/L (ref 22–32)
Calcium: 9 mg/dL (ref 8.9–10.3)
Chloride: 104 mmol/L (ref 101–111)
Creatinine, Ser: 0.81 mg/dL (ref 0.44–1.00)
GFR calc Af Amer: >60 mL/min (ref >60)
Glucose, Bld: 92 mg/dL (ref 65–99)
Potassium: 3.7 mmol/L (ref 3.5–5.1)
SODIUM: 137 mmol/L (ref 135–145)
Total Bilirubin: 0.6 mg/dL (ref 0.3–1.2)
Total Protein: 7.3 g/dL (ref 6.5–8.1)

## 2016-12-09 LAB — HCG, QUANTITATIVE, PREGNANCY: HCG, BETA CHAIN, QUANT, S: 112 m[IU]/mL — AB (ref <5)

## 2016-12-09 LAB — TYPE AND SCREEN
ABO/RH(D): A POS
ANTIBODY SCREEN: NEGATIVE

## 2016-12-09 SURGERY — DILATION AND EVACUATION, UTERUS
Anesthesia: General | Site: Vagina | Wound class: Clean Contaminated

## 2016-12-09 MED ORDER — ONDANSETRON HCL 4 MG/2ML IJ SOLN: 4.0000 mg | Freq: Once | INTRAMUSCULAR | Status: DC | PRN

## 2016-12-09 MED ORDER — MIDAZOLAM HCL 2 MG/2ML IJ SOLN
INTRAMUSCULAR | Status: DC | PRN
Start: 2016-12-09 — End: 2016-12-09
  Administered 2016-12-09: 2 mg via INTRAVENOUS

## 2016-12-09 MED ORDER — METHYLERGONOVINE MALEATE 0.2 MG/ML IJ SOLN
INTRAMUSCULAR | Status: AC
  Filled 2016-12-09: qty 1

## 2016-12-09 MED ORDER — ONDANSETRON HCL 4 MG/2ML IJ SOLN
INTRAMUSCULAR | Status: AC
  Filled 2016-12-09: qty 2

## 2016-12-09 MED ORDER — ACETAMINOPHEN 500 MG PO TABS
1000.0000 mg | ORAL_TABLET | Freq: Once | ORAL | Status: AC
  Administered 2016-12-09: 1000 mg via ORAL
  Filled 2016-12-09: qty 2

## 2016-12-09 MED ORDER — DOXYCYCLINE HYCLATE 100 MG PO TABS
100.0000 mg | ORAL_TABLET | Freq: Two times a day (BID) | ORAL | Status: DC
  Administered 2016-12-09: 100 mg via ORAL
  Filled 2016-12-09 (×4): qty 1

## 2016-12-09 MED ORDER — DOXYCYCLINE HYCLATE 100 MG PO TABS: 100.0000 mg | ORAL_TABLET | Freq: Two times a day (BID) | ORAL | 0 refills | Status: DC

## 2016-12-09 MED ORDER — PROPOFOL 500 MG/50ML IV EMUL
INTRAVENOUS | Status: DC | PRN
  Administered 2016-12-09: 125 ug/kg/min via INTRAVENOUS

## 2016-12-09 MED ORDER — LACTATED RINGERS IV SOLN
INTRAVENOUS | Status: DC | PRN
  Administered 2016-12-09: 20:00:00 via INTRAVENOUS

## 2016-12-09 MED ORDER — MIDAZOLAM HCL 2 MG/2ML IJ SOLN
INTRAMUSCULAR | Status: AC
  Filled 2016-12-09: qty 2

## 2016-12-09 MED ORDER — SUCCINYLCHOLINE CHLORIDE 20 MG/ML IJ SOLN
INTRAMUSCULAR | Status: AC
  Filled 2016-12-09: qty 1

## 2016-12-09 MED ORDER — FENTANYL CITRATE (PF) 100 MCG/2ML IJ SOLN
INTRAMUSCULAR | Status: DC | PRN
  Administered 2016-12-09 (×2): 50 ug via INTRAVENOUS

## 2016-12-09 MED ORDER — SODIUM CHLORIDE 0.9 % IV BOLUS (SEPSIS)
1000.0000 mL | Freq: Once | INTRAVENOUS | Status: AC
  Administered 2016-12-09: 1000 mL via INTRAVENOUS

## 2016-12-09 MED ORDER — FENTANYL CITRATE (PF) 100 MCG/2ML IJ SOLN: 25.0000 ug | INTRAMUSCULAR | Status: DC | PRN

## 2016-12-09 MED ORDER — PROPOFOL 10 MG/ML IV BOLUS
INTRAVENOUS | Status: AC
  Filled 2016-12-09: qty 20

## 2016-12-09 MED ORDER — FENTANYL CITRATE (PF) 100 MCG/2ML IJ SOLN
INTRAMUSCULAR | Status: AC
  Filled 2016-12-09: qty 2

## 2016-12-09 MED ORDER — PROPOFOL 10 MG/ML IV BOLUS
INTRAVENOUS | Status: DC | PRN
  Administered 2016-12-09: 20 mg via INTRAVENOUS

## 2016-12-09 MED ORDER — KETOROLAC TROMETHAMINE 30 MG/ML IJ SOLN
30.0000 mg | Freq: Once | INTRAMUSCULAR | Status: AC
  Administered 2016-12-09: 30 mg via INTRAVENOUS
  Filled 2016-12-09: qty 1

## 2016-12-09 MED ORDER — LIDOCAINE HCL (PF) 2 % IJ SOLN
INTRAMUSCULAR | Status: AC
  Filled 2016-12-09: qty 2

## 2016-12-09 MED ORDER — LORAZEPAM 1 MG PO TABS
1.0000 mg | ORAL_TABLET | Freq: Once | ORAL | Status: AC
  Administered 2016-12-09: 1 mg via ORAL
  Filled 2016-12-09: qty 1

## 2016-12-09 MED ORDER — SILVER NITRATE-POT NITRATE 75-25 % EX MISC
CUTANEOUS | Status: AC
  Filled 2016-12-09: qty 12

## 2016-12-09 MED ORDER — IBUPROFEN 800 MG PO TABS: 800.0000 mg | ORAL_TABLET | Freq: Three times a day (TID) | ORAL | 0 refills | Status: AC | PRN

## 2016-12-09 SURGICAL SUPPLY — 20 items
CATH ROBINSON RED A/P 16FR (CATHETERS) ×3 IMPLANT
FILTER UTR ASPR SPEC (MISCELLANEOUS) ×1 IMPLANT
FLTR UTR ASPR SPEC (MISCELLANEOUS) ×3
GLOVE BIO SURGEON STRL SZ7 (GLOVE) ×6 IMPLANT
GLOVE INDICATOR 7.5 STRL GRN (GLOVE) ×6 IMPLANT
GOWN STRL REUS W/ TWL LRG LVL3 (GOWN DISPOSABLE) ×2 IMPLANT
GOWN STRL REUS W/TWL LRG LVL3 (GOWN DISPOSABLE) ×4
KIT BERKELEY 1ST TRIMESTER 3/8 (MISCELLANEOUS) ×3 IMPLANT
KIT RM TURNOVER CYSTO AR (KITS) ×3 IMPLANT
PACK DNC HYST (MISCELLANEOUS) ×3 IMPLANT
PAD OB MATERNITY 4.3X12.25 (PERSONAL CARE ITEMS) ×3 IMPLANT
PAD PREP 24X41 OB/GYN DISP (PERSONAL CARE ITEMS) ×3 IMPLANT
SET BERKELEY SUCTION TUBING (SUCTIONS) ×3 IMPLANT
TOWEL OR 17X26 4PK STRL BLUE (TOWEL DISPOSABLE) ×3 IMPLANT
VACURETTE 10 RIGID CVD (CANNULA) IMPLANT
VACURETTE 6 ASPIR F TIP BERK (CANNULA) IMPLANT
VACURETTE 7MM F TIP (CANNULA)
VACURETTE 7MM F TIP STRL (CANNULA) IMPLANT
VACURETTE 8 RIGID CVD (CANNULA) ×3 IMPLANT
VACURETTE 8MM F TIP (MISCELLANEOUS) IMPLANT

## 2016-12-09 NOTE — Op Note (Signed)
Operative Report Suction Dilation and Curettage   Indications: Vaginal bleeding after SAB in 09/2016, s/p cytotec, office D&C and provera by Dr. Elesa MassedWard without full resolution of bleeding. Significant gush of vaginal bleeding today at work, continued through the afternoon. TVUS with heterogenous enlarged endometrial stripe. Beta quant 112.  Pre-operative Diagnosis: Retained products of conception  Post-operative Diagnosis: same.  Procedure: 1. Suction D&C  Surgeon: Christeen DouglasBethany Loreal Schuessler, MD  Assistant(s):  None  Anesthesia: Monitored Local Anesthesia with Sedation  Anesthesiologist: Yevette EdwardsAdams, James G, MD Anesthesiologist: Yevette EdwardsAdams, James G, MD CRNA: Waldo LaineJustis, Denise, CRNA  Estimated Blood Loss:  50ml         Intraoperative medications: none         Total IV Fluids: 300ml  Urine Output: 25ml         Specimens: Endometrial curettings         Complications:  None; patient tolerated the procedure well.         Disposition: PACU - hemodynamically stable.         Condition: stable  Findings: Uterus measuring 6 weeks; normal cervix, vagina, perineum. Open cervix, dark vaginal bleeding  Indication for procedure/Consents: 46 y.o. G4P1 here for unscheduled surgery from the ER for the aforementioned diagnoses. Risks of surgery were discussed with the patient including but not limited to: bleeding which may require transfusion; infection which may require antibiotics; injury to uterus or surrounding organs; intrauterine scarring which may impair future fertility; need for additional procedures including laparotomy or laparoscopy; and other postoperative/anesthesia complications. Written informed consent was obtained.    Procedure Details:   The patient received oral antibiotics while in the preoperative area.  She was then taken to the operating room where general anesthesia was administered and was found to be adequate.  After a formal and adequate timeout was performed, she was placed in the dorsal  lithotomy position and examined with the above findings. She was then prepped and draped in the sterile manner.   Her bladder was catheterized for an estimated amount of clear, yellow urine. A speculum was then placed in the patient's vagina and a single tooth tenaculum was applied to the anterior lip of the cervix.    No uterine sounding was performed on this pregnant uterus. Her cervix was serially dilated to accommodate a 7 sized flexible suction curette.  A sharp curettage was then performed until there was a gritty texture in all four quadrants.  The tenaculum was removed from the anterior lip of the cervix and the vaginal speculum was removed after noting good hemostasis. The patient tolerated the procedure well and was taken to the recovery area awake, extubated and in stable condition.  The patient will be discharged to home as per PACU criteria.  She will receive another dose of oral antibiotics prior to discharge. Routine postoperative instructions given.  She was prescribed Percocet, Ibuprofen and Colace.  She will follow up in the clinic in two weeks for postoperative evaluation.

## 2016-12-09 NOTE — ED Notes (Signed)
Pt belongings in belongings bag with family

## 2016-12-09 NOTE — Anesthesia Postprocedure Evaluation (Signed)
Anesthesia Post Note  Patient: Tracy Gillespie  Procedure(s) Performed: Procedure(s) (LRB): DILATATION AND EVACUATION (N/A)  Patient location during evaluation: PACU Anesthesia Type: General Level of consciousness: awake and alert Pain management: pain level controlled Vital Signs Assessment: post-procedure vital signs reviewed and stable Respiratory status: spontaneous breathing, nonlabored ventilation, respiratory function stable and patient connected to nasal cannula oxygen Cardiovascular status: blood pressure returned to baseline and stable Postop Assessment: no signs of nausea or vomiting Anesthetic complications: no     Last Vitals:  Vitals:   12/09/16 2044 12/09/16 2059  BP: (!) 98/49 94/63  Pulse: (!) 56 (!) 47  Resp: 16 10  Temp: 36.6 C     Last Pain:  Vitals:   12/09/16 2044  TempSrc:   PainSc: 0-No pain                 Yevette EdwardsJames G Adams

## 2016-12-09 NOTE — ED Triage Notes (Signed)
Pt reports that she is having vaginal bleeding - she reports that she had a miscarriage March 15th at 10 weeks - she was given pills to miscarry naturally and then had a "d&e" and given provera - she has still been bleeding the entire time but the bleeding became heavier today - pt has appt tomorrow to discuss "d&c" but her OB/GYN advised her to come to the er today for eval and treatment

## 2016-12-09 NOTE — Anesthesia Preprocedure Evaluation (Signed)
Anesthesia Evaluation  Patient identified by MRN, date of birth, ID band Patient awake    Reviewed: Allergy & Precautions, H&P , NPO status , Patient's Chart, lab work & pertinent test results, reviewed documented beta blocker date and time   Airway Mallampati: II   Neck ROM: full    Dental  (+) Poor Dentition   Pulmonary neg pulmonary ROS,    Pulmonary exam normal        Cardiovascular negative cardio ROS Normal cardiovascular exam Rhythm:regular Rate:Normal     Neuro/Psych negative neurological ROS  negative psych ROS   GI/Hepatic negative GI ROS, Neg liver ROS,   Endo/Other  negative endocrine ROS  Renal/GU negative Renal ROS  negative genitourinary   Musculoskeletal   Abdominal   Peds  Hematology negative hematology ROS (+)   Anesthesia Other Findings Past Medical History: No date: Miscarriage No date: Thyroid disease Past Surgical History: No date: TONSILLECTOMY BMI    Body Mass Index:  24.13 kg/m     Reproductive/Obstetrics negative OB ROS                             Anesthesia Physical Anesthesia Plan  ASA: II  Anesthesia Plan: General   Post-op Pain Management:    Induction:   Airway Management Planned:   Additional Equipment:   Intra-op Plan:   Post-operative Plan:   Informed Consent: I have reviewed the patients History and Physical, chart, labs and discussed the procedure including the risks, benefits and alternatives for the proposed anesthesia with the patient or authorized representative who has indicated his/her understanding and acceptance.   Dental Advisory Given  Plan Discussed with: CRNA  Anesthesia Plan Comments:         Anesthesia Quick Evaluation

## 2016-12-09 NOTE — Anesthesia Post-op Follow-up Note (Cosign Needed)
Anesthesia QCDR form completed.        

## 2016-12-09 NOTE — ED Provider Notes (Signed)
[facility] Emergency Department Provider Note  ____________________________________________  Time seen: Approximately 3:46 PM  I have reviewed the triage vital signs and the nursing notes.   HISTORY  Chief Complaint Vaginal Bleeding    HPI Tracy Gillespie is a 46 y.o. female G4P1 with miscarriage 3/10 presenting with vaginal bleeding. The patient reports that she had a failed twin pregnancy resulting in miscarriage that was treated initially with Cytotec 09/26/16. She then underwent D&E, and was placed on Provera when she continued to have bleeding. 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 She denies any fever, nausea or vomiting. She does have some associated lower pelvic cramping.   Past Medical History:  Diagnosis Date  . Miscarriage   . Thyroid disease     There are no active problems to display for this patient.   Past Surgical History:  Procedure Laterality Date  . TONSILLECTOMY      Current Outpatient Rx  . Order #: [id number] Class: Historical Med  . Order #: [id number] Class: Historical Med  . Order #: [id number] Class: Historical Med  . Order #: [id number] Class: Print    Allergies Patient has no known allergies.  Family History  Problem Relation Age of Onset  . Breast cancer Neg Hx     Social History Social History  Substance Use Topics  . Smoking status: Never Smoker  . Smokeless tobacco: Never Used  . Alcohol use No    Review of Systems Constitutional: No fever/chills.No lightheadedness or syncope. Eyes: No visual changes. ENT: . No congestion or rhinorrhea. Cardiovascular: Denies chest pain. Denies palpitations. Respiratory: Denies shortness of breath.  No cough. Gastrointestinal: No abdominal pain.  No nausea, no vomiting.  No diarrhea.  No constipation. Genitourinary: Negative for dysuria. Positive pelvic cramping and vaginal bleeding. Musculoskeletal: Negative for back  pain. Skin: Negative for rash. Neurological: Negative for headaches. No focal numbness, tingling or weakness.   10-point ROS otherwise negative.  ____________________________________________   PHYSICAL EXAM:  VITAL SIGNS: ED Triage Vitals [12/09/16 1448]  Enc Vitals Group     BP (!) 122/57     Pulse Rate 67     Resp 16     Temp 98.3 F (36.8 C)     Temp Source Oral     SpO2 100 %     Weight 145 lb (65.8 kg)     Height 5\' 5"  (1.651 m)     Head Circumference      Peak Flow      Pain Score 4     Pain Loc      Pain Edu?      Excl. in GC?     Constitutional: Alert and oriented. Well appearing and in no acute distress. Answers questions appropriately. Eyes: Conjunctivae are normal.  EOMI. No scleral icterus. Head: Atraumatic. Nose: No congestion/rhinnorhea. Mouth/Throat: Mucous membranes are moist.  Neck: No stridor.  Supple.   Cardiovascular: Normal rate, regular rhythm. No murmurs, rubs or gallops.  Respiratory: Normal respiratory effort.  No accessory muscle use or retractions. Lungs CTAB.  No wheezes, rales or ronchi. Gastrointestinal: Soft, nontender and nondistended.  No guarding or rebound.  No peritoneal signs. Genitourinary: Deferred as the patient will undergo gynecologic procedure. Musculoskeletal: No LE edema. No ttp in the calves or palpable cords.  Negative Homan's sign. Neurologic:  A&Ox3.  Speech is clear.  Face and smile are symmetric.  EOMI.  Moves all extremities well. Skin:  Skin is warm, dry and intact. No rash  noted. Psychiatric: Anxious mood, intermittently tearful. Speech and behavior are normal.  Normal judgement.  ____________________________________________   LABS (all labs ordered are listed, but only abnormal results are displayed)  Labs Reviewed  CBC WITH DIFFERENTIAL/PLATELET  COMPREHENSIVE METABOLIC PANEL  HCG, QUANTITATIVE, PREGNANCY  TYPE AND SCREEN   ____________________________________________  EKG  Not  indicated ____________________________________________  RADIOLOGY  No results found.  ____________________________________________   PROCEDURES  Procedure(s) performed: None  Procedures  Critical Care performed: No ____________________________________________   INITIAL IMPRESSION / ASSESSMENT AND PLAN / ED COURSE  Pertinent labs & imaging results that were available during my care of the patient were reviewed by me and considered in my medical decision making (see chart for details).  46 y.o. G4P1 presenting with ongoing vaginal bleeding after miscarriage treated with Cytotec and D&E.  The patient is hemodynamically stable, and her laboratory studies are reassuring for stable blood counts. I've spoken with Dr. Dalbert Garnet, who would like to admit the patient for D&C.  Intravenous fluids, symptomatic treatment, and ultrasound have been ordered  ____________________________________________  FINAL CLINICAL IMPRESSION(S) / ED DIAGNOSES  Final diagnoses:  Dysfunctional uterine bleeding  Anxiety         NEW MEDICATIONS STARTED DURING THIS VISIT:  New Prescriptions   No medications on file      Rockne Menghini, MD 12/09/16 1614

## 2016-12-09 NOTE — ED Notes (Signed)
Consulting provider to bedside at this time.

## 2016-12-09 NOTE — H&P (Signed)
Consult History and Physical   SERVICE: Gynecology   Patient Name: Tracy Gillespie Patient MRN:   [id number]  CC: Vaginal bleeding after MAB  HPI: Tracy Gillespie is a 46 y.o. G4P1 with hx of SAB in 09/26/16, treated with expectant management, then cytotec with Dr. Elesa MassedWard, then office manual D&C. She has had ongoing bleeding, and was treated with Provera without success. Her bleeding had subsided, but then at work today she had several large gushes, but saturate her clothes, and made her feel headed faint and tachycardic.  Her beta hCG today is 112. Her ultrasound shows atrial thickness of 27 mm, with heterogeneous contents both solid and cystic.  Despite her loss of blood, her vital signs are normal. She is not orthostatic. Her white count is normal, and her H&H normal. She has had chills for the last week, but no fever. She does not have myalgias or other signs of infection.  The pathology from our office D&C did note products of conception.  Review of Systems: positives in bold GEN:   fevers, chills, weight changes, appetite changes, fatigue, night sweats HEENT:  HA, vision changes, hearing loss, congestion, rhinorrhea, sinus pressure, dysphagia CV:   CP, palpitations PULM:  SOB, cough GI:  abd pain, N/V/D/C GU:  dysuria, urgency, frequency MSK:  arthralgias, myalgias, back pain, swelling SKIN:  rashes, color changes, pallor NEURO:  numbness, weakness, tingling, seizures, dizziness, tremors PSYCH:  depression, anxiety, behavioral problems, confusion  HEME/LYMPH:  easy bruising or bleeding ENDO:  heat/cold intolerance  Past Obstetrical History: OB History    No data available      Past Gynecologic History: No LMP recorded.    Past Medical History: Past Medical History:  Diagnosis Date  . Miscarriage   . Thyroid disease     Past Surgical History:   Past Surgical History:  Procedure Laterality Date  . TONSILLECTOMY      Family History:  family history is not  on file.  Social History:  Social History   Social History  . Marital status: Married    Spouse name: N/A  . Number of children: N/A  . Years of education: N/A   Occupational History  . Not on file.   Social History Main Topics  . Smoking status: Never Smoker  . Smokeless tobacco: Never Used  . Alcohol use No  . Drug use: No  . Sexual activity: Not on file   Other Topics Concern  . Not on file   Social History Narrative  . No narrative on file    Home Medications:  Medications reconciled in EPIC  No current facility-administered medications on file prior to encounter.    Current Outpatient Prescriptions on File Prior to Encounter  Medication Sig Dispense Refill  . azelastine (ASTELIN) 0.1 % nasal spray Place into both nostrils 2 (two) times daily. Use in each nostril as directed    . fluticasone (FLONASE) 50 MCG/ACT nasal spray Place into both nostrils daily.    . Levothyroxine Sodium 88 MCG CAPS Take 88 mcg by mouth daily before breakfast.     . naproxen (NAPROSYN) 500 MG tablet Take 1 tablet (500 mg total) by mouth 2 (two) times daily. X 7 days (Patient not taking: Reported on 12/09/2016) 14 tablet 0    Allergies:  No Known Allergies  Physical Exam:  Temp:  [98.3 F (36.8 C)] 98.3 F (36.8 C) (05/23 1448) Pulse Rate:  [67] 67 (05/23 1448) Resp:  [16] 16 (05/23 1448) BP: (122)/(57) 122/57 (05/23 1448)  SpO2:  [100 %] 100 % (05/23 1448) Weight:  [145 lb (65.8 kg)] 145 lb (65.8 kg) (05/23 1448)   General Appearance:  Well developed, well nourished, no acute distress, alert and oriented x3 HEENT:  Normocephalic atraumatic, extraocular movements intact, moist mucous membranes Cardiovascular:  Normal S1/S2, regular rate and rhythm, no murmurs Pulmonary:  clear to auscultation, no wheezes, rales or rhonchi, symmetric air entry, good air exchange Abdomen:  Bowel sounds present, soft, nontender, nondistended, no abnormal masses, no epigastric pain Extremities:  Full  range of motion, no pedal edema, 2+ distal pulses, no tenderness Skin:  normal coloration and turgor, no rashes, no suspicious skin lesions noted  Neurologic:  Cranial nerves 2-12 grossly intact, normal muscle tone, strength 5/5 all four extremities Psychiatric:  Normal mood and affect, appropriate, no AH/VH Pelvic:  NEFG, no abdominal or uterine tenderess   Labs/Studies:   CBC and Coags:  Lab Results  Component Value Date   WBC 8.2 12/09/2016   NEUTOPHILPCT 55 12/09/2016   EOSPCT 1 12/09/2016   BASOPCT 1 12/09/2016   LYMPHOPCT 37 12/09/2016   HGB 12.1 12/09/2016   HCT 35.7 12/09/2016   MCV 87.6 12/09/2016   PLT 292 12/09/2016   CMP:  Lab Results  Component Value Date   NA 137 12/09/2016   K 3.7 12/09/2016   CL 104 12/09/2016   CO2 27 12/09/2016   BUN 9 12/09/2016   CREATININE 0.81 12/09/2016   PROT 7.3 12/09/2016   BILITOT 0.6 12/09/2016   ALT 14 12/09/2016   AST 18 12/09/2016   ALKPHOS 49 12/09/2016   Other Labs:  Other Imaging: US Ob Comp Less 14 Wks  Result Date: 12/09/2016 CLINICAL DATA:  46 y/o F; failed twin pregnancy treated with Cytotec with persistent bleeding since March. Elevated HCG. EXAM: TRANSABDOMINAL AND TRANSVAGINAL ULTRASOUND OF PELVIS TECHNIQUE: Both transabdominal and transvaginal ultrasound examinations of the pelvis were performed. Transabdominal technique was performed for global imaging of the pelvis including uterus, ovaries, adnexal regions, and pelvic cul-de-sac. It was necessary to proceed with endovaginal exam following the transabdominal exam to visualize the endometrium and ovaries. COMPARISON:  None FINDINGS: Uterus Measurements: 9.3 x 6.2 x 6.5 cm. No fibroids or other mass visualized. Endometrium Thickness: 27 mm. Heterogeneous echotexture of endometrial contents with solid and cystic areas. Right ovary Measurements: 2.6 x 1.7 x 2.0 cm . Normal appearance/no adnexal mass. 1.7 cm simple appearing cyst. Left ovary Measurements: 1.7 x 0.8 x  1.5 cm. Normal appearance/no adnexal mass. Other findings No abnormal free fluid. IMPRESSION: Heterogeneous and markedly thickened endometrium with history of failed pregnancy and persistent elevated beta HCG may represent retained products of conception. No intrauterine or extrauterine pregnancy is identified. Given the masslike heterogeneous appearance of endometrium, gestational trophoblastic disease is also considered. Clinical correlation recommended. Electronically Signed   By: Mitzi Hansen M.D.   On: 12/09/2016 17:44   US Ob Transvaginal  Result Date: 12/09/2016 CLINICAL DATA:  46 y/o F; failed twin pregnancy treated with Cytotec with persistent bleeding since March. Elevated HCG. EXAM: TRANSABDOMINAL AND TRANSVAGINAL ULTRASOUND OF PELVIS TECHNIQUE: Both transabdominal and transvaginal ultrasound examinations of the pelvis were performed. Transabdominal technique was performed for global imaging of the pelvis including uterus, ovaries, adnexal regions, and pelvic cul-de-sac. It was necessary to proceed with endovaginal exam following the transabdominal exam to visualize the endometrium and ovaries. COMPARISON:  None FINDINGS: Uterus Measurements: 9.3 x 6.2 x 6.5 cm. No fibroids or other mass visualized. Endometrium Thickness: 27 mm. Heterogeneous echotexture  of endometrial contents with solid and cystic areas. Right ovary Measurements: 2.6 x 1.7 x 2.0 cm . Normal appearance/no adnexal mass. 1.7 cm simple appearing cyst. Left ovary Measurements: 1.7 x 0.8 x 1.5 cm. Normal appearance/no adnexal mass. Other findings No abnormal free fluid. IMPRESSION: Heterogeneous and markedly thickened endometrium with history of failed pregnancy and persistent elevated beta HCG may represent retained products of conception. No intrauterine or extrauterine pregnancy is identified. Given the masslike heterogeneous appearance of endometrium, gestational trophoblastic disease is also considered. Clinical  correlation recommended. Electronically Signed   By: Mitzi Hansen M.D.   On: 12/09/2016 17:44     Assessment / Plan:   RONIYA TETRO is a 46 y.o. G4P1 who presents with vaginal bleeding, likely retained products of conception. Differential diagnosis includes endometritis, AVM, perimenopausal bleeding.  1. Retained products of conception: Emotional support given. I have recommended  suction D&C for retained products. I will also culture endometrial curettings.  Consents signed today. Risks of surgery were discussed with the patient including but not limited to: bleeding which may require transfusion; infection which may require antibiotics; injury to uterus or surrounding organs; intrauterine scarring which may impair future fertility; need for additional procedures including laparotomy or laparoscopy; and other postoperative/anesthesia complications. Written informed consent was obtained.  This is a scheduled same-day surgery. She will have a postop visit in 2 weeks to review operative findings and pathology.  Thank you for the opportunity to be involved with this pt's care.

## 2016-12-09 NOTE — Transfer of Care (Signed)
Immediate Anesthesia Transfer of Care Note  Patient: Orpah CobbStephanie Oftedahl  Procedure(s) Performed: Procedure(s): DILATATION AND EVACUATION (N/A)  Patient Location: PACU  Anesthesia Type:General  Level of Consciousness: awake, alert , oriented and patient cooperative  Airway & Oxygen Therapy: Patient Spontanous Breathing  Post-op Assessment: Report given to RN and Post -op Vital signs reviewed and stable  Post vital signs: Reviewed and stable  Last Vitals:  Vitals:   12/09/16 1448 12/09/16 1912  BP: (!) 122/57 (!) 115/54  Pulse: 67 62  Resp: 16 14  Temp: 36.8 C     Last Pain:  Vitals:   12/09/16 1448  TempSrc: Oral  PainSc: 4          Complications: No apparent anesthesia complications

## 2016-12-10 ENCOUNTER — Encounter: Payer: Self-pay | Admitting: Obstetrics and Gynecology

## 2016-12-11 LAB — SURGICAL PATHOLOGY

## 2016-12-17 LAB — AEROBIC/ANAEROBIC CULTURE (SURGICAL/DEEP WOUND): CULTURE: NORMAL

## 2016-12-17 LAB — AEROBIC/ANAEROBIC CULTURE W GRAM STAIN (SURGICAL/DEEP WOUND)

## 2017-01-06 ENCOUNTER — Ambulatory Visit: Payer: Self-pay | Attending: Orthopedic Surgery | Admitting: Occupational Therapy

## 2017-01-06 DIAGNOSIS — M25521 Pain in right elbow: Secondary | ICD-10-CM | POA: Insufficient documentation

## 2017-01-06 NOTE — Therapy (Signed)
[facility] PH[facility] AND [facility] [address] [place], [place], [address] Phone: [phone]   Fax:  [phone]  Occupational Therapy screen   Patient Details  Name: Tracy Gillespie MRN: [id number] Date of Birth: 11/01/1970 Referring Provider: Lenard Forthmundy   Encounter Date: 01/06/2017    Past Medical History:  Diagnosis Date  . Miscarriage   . Thyroid disease     Past Surgical History:  Procedure Laterality Date  . DILATION AND EVACUATION N/A 12/09/2016   Procedure: DILATATION AND EVACUATION;  Surgeon: Christeen DouglasBeasley, Bethany, MD;  Location: [facility] ORS;  Service: Gynecology;  Laterality: N/A;  . TONSILLECTOMY      There were no vitals filed for this visit.      Subjective Assessment - 01/06/17 1740    Subjective  It is about  year now since you treated me - wanted to check back - and see how I am doing - at times feel twinch - and tender but as if I hit it - but otherwise do not bother me at all - can do everything             [facility] - 01/06/17 0001      Strength   Right Hand Grip (lbs) 66  55   Left Hand Grip (lbs) 65  66      Pt still tender at times at lat epicondyle - but about 1/10  Not  Pain with 3rd digit or wrist extention  Strength in wrist and elbow in all planes 5/5  With no pain  Grip assess and compare- see flow sheet  Pt to phone if she needs me - but do not have to return for check ups              OT Treatments/Exercises (OP) - 01/06/17 0001      Moist Heat Therapy   Number Minutes Moist Heat 10 Minutes   Moist Heat Location Elbow                  OT Short Term Goals - 03/13/16 1205      OT SHORT TERM GOAL #1   Title Pain on PRTEE improve with at leat 10 points    Status Achieved     OT SHORT TERM GOAL #2   Title Pt to show decrease stiffness in am and night time and decrease discomfort with stretches of wrist extensors    Status Achieved           OT Long Term  Goals - 03/13/16 1206      OT LONG TERM GOAL #1   Title Function improve on PRTEE by at least 10-12 points in using R hand    Status Achieved     OT LONG TERM GOAL #2   Title R grip strength improve at least  5-8 lbs to lift and grabb objects with less than 6/10 pain    Baseline grip decrease from 60 to 50 - and with extended arm 45 R , L 65    Time 6   Period Weeks   Status On-going             Patient will benefit from skilled therapeutic intervention in order to improve the following deficits and impairments:     Visit Diagnosis: Pain in right elbow    Problem List There are no active problems to display for this patient.   Oletta CohnuPreez, Ja Pistole OTR/L,CLT 01/06/2017, 5:42 PM  [facility] [address] [place], [address] Phone: [phone]   Fax:  [phone]  Name: Tracy Gillespie MRN: [id number] Date of Birth: 1970-12-07

## 2017-03-07 ENCOUNTER — Emergency Department: Payer: BC Managed Care – PPO

## 2017-03-07 ENCOUNTER — Emergency Department
Admission: EM | Admit: 2017-03-07 | Discharge: 2017-03-07 | Disposition: A | Discharge status: Home / Self Care | Payer: BC Managed Care – PPO | Attending: Emergency Medicine | Admitting: Emergency Medicine

## 2017-03-07 ENCOUNTER — Encounter: Payer: Self-pay | Admitting: Emergency Medicine

## 2017-03-07 DIAGNOSIS — R002 Palpitations: Secondary | ICD-10-CM | POA: Insufficient documentation

## 2017-03-07 DIAGNOSIS — E079 Disorder of thyroid, unspecified: Secondary | ICD-10-CM

## 2017-03-07 DIAGNOSIS — Z79899 Other long term (current) drug therapy: Secondary | ICD-10-CM | POA: Insufficient documentation

## 2017-03-07 LAB — TROPONIN I

## 2017-03-07 LAB — COMPREHENSIVE METABOLIC PANEL
ALT: 20 U/L (ref 14–54)
ANION GAP: 6 (ref 5–15)
AST: 26 U/L (ref 15–41)
Albumin: 4 g/dL (ref 3.5–5.0)
Alkaline Phosphatase: 54 U/L (ref 38–126)
BUN: 10 mg/dL (ref 6–20)
CHLORIDE: 103 mmol/L (ref 101–111)
CO2: 27 mmol/L (ref 22–32)
Calcium: 8.9 mg/dL (ref 8.9–10.3)
Creatinine, Ser: 0.66 mg/dL (ref 0.44–1.00)
GFR calc non Af Amer: >60 mL/min (ref >60)
Glucose, Bld: 87 mg/dL (ref 65–99)
Potassium: 3.6 mmol/L (ref 3.5–5.1)
SODIUM: 136 mmol/L (ref 135–145)
Total Bilirubin: 0.4 mg/dL (ref 0.3–1.2)
Total Protein: 7.2 g/dL (ref 6.5–8.1)

## 2017-03-07 LAB — CBC
HCT: 36.7 % (ref 35.0–47.0)
Hemoglobin: 12.5 g/dL (ref 12.0–16.0)
MCH: 29.6 pg (ref 26.0–34.0)
MCHC: 34.1 g/dL (ref 32.0–36.0)
MCV: 86.8 fL (ref 80.0–100.0)
PLATELETS: 290 10*3/uL (ref 150–440)
RBC: 4.23 MIL/uL (ref 3.80–5.20)
RDW: 15.2 % — AB (ref 11.5–14.5)
WBC: 7.6 10*3/uL (ref 3.6–11.0)

## 2017-03-07 LAB — FIBRIN DERIVATIVES D-DIMER (ARMC ONLY): Fibrin derivatives D-dimer (ARMC): 208.84 (ref 0.00–499.00)

## 2017-03-07 LAB — TSH: TSH: 4.627 u[IU]/mL — AB (ref 0.350–4.500)

## 2017-03-07 NOTE — Discharge Instructions (Signed)
Your TSH was 4.6. You should call your endocrinologist tomorrow to discuss this result and should follow up with them as instructed. Return to the ER for new or worsening palpitations, shortness of breath, any chest pain, weakness or any other new or worsening symptoms that concern you.

## 2017-03-07 NOTE — ED Triage Notes (Signed)
Pt to ED from [facility] in stating that her heart has been racing all day. Pt states that her normal resting HR is in the 60's and today her heart rate was in the 80's. Pt states that she has hx/o thyroid disease. Pt states that she has had increased stress recently. Pt denies increased caffeine intake.pt does not appear to be in any distress at this time. HR in triage 73 and regular.

## 2017-03-07 NOTE — ED Provider Notes (Signed)
[facility] Emergency Department Provider Note ____________________________________________   First MD Initiated Contact with Patient 03/07/17 1954     (approximate)  I have reviewed the triage vital signs and the nursing notes.   HISTORY  Chief Complaint Palpitations    HPI Tracy Gillespie is a 46 y.o. female who presents with palpitations intermittently over one week but more severe and frequent in the last day. Patient describes a sensation of her heart racing and it is associated with a few episodes of shortness of breath. Patient denies chest pain or lightheadedness. Patient denies any recent illness, fever or chills, vomiting, diarrhea, or urinary symptoms. She denies any recent leg pain or swelling, denies smoking, and denies any exogenous estrogen or recent immobilization.  No prior history of this symptom.Patient states she has hypothyroid and is on medication and has been compliant.  Past Medical History:  Diagnosis Date  . Miscarriage   . Thyroid disease     There are no active problems to display for this patient.   Past Surgical History:  Procedure Laterality Date  . DILATION AND EVACUATION N/A 12/09/2016   Procedure: DILATATION AND EVACUATION;  Surgeon: Christeen Douglas, MD;  Location: [facility] ORS;  Service: Gynecology;  Laterality: N/A;  . TONSILLECTOMY      Prior to Admission medications   Medication Sig Start Date End Date Taking? Authorizing Provider  azelastine (ASTELIN) 0.1 % nasal spray Place into both nostrils 2 (two) times daily. Use in each nostril as directed    [provider]  doxycycline (VIBRA-TABS) 100 MG tablet Take 1 tablet (100 mg total) by mouth every 12 (twelve) hours. 12/09/16   Christeen Douglas, MD  fluticasone (FLONASE) 50 MCG/ACT nasal spray Place into both nostrils daily.    [provider]  Levothyroxine Sodium 88 MCG CAPS Take 88 mcg by mouth daily before breakfast.     [provider]   medroxyPROGESTERone (PROVERA) 10 MG tablet Take 20 mg by mouth 2 (two) times daily.    [provider]  Multiple Vitamin (MULTIVITAMIN) tablet Take 1 tablet by mouth daily.    [provider]  naproxen (NAPROSYN) 500 MG tablet Take 1 tablet (500 mg total) by mouth 2 (two) times daily. X 7 days Patient not taking: Reported on 12/09/2016 03/03/15   Domenick Gong, MD    Allergies Patient has no known allergies.  Family History  Problem Relation Age of Onset  . Breast cancer Neg Hx     Social History Social History  Substance Use Topics  . Smoking status: Never Smoker  . Smokeless tobacco: Never Used  . Alcohol use No    Review of Systems  Constitutional: No fever/chills Eyes: No visual changes. ENT: No sore throat. Cardiovascular: Denies chest pain. Positive for palpitations. Respiratory: positive for shortness of breath. Gastrointestinal: No nausea, no vomiting.  No diarrhea.  Genitourinary: Negative for dysuria.  Musculoskeletal: Negative for back pain. Skin: Negative for rash. Neurological: Negative for headaches, focal weakness or numbness.   ____________________________________________   PHYSICAL EXAM:  VITAL SIGNS: ED Triage Vitals  Enc Vitals Group     BP 03/07/17 1638 104/68     Pulse Rate 03/07/17 1638 76     Resp 03/07/17 1638 16     Temp 03/07/17 1638 98.8 F (37.1 C)     Temp Source 03/07/17 1638 Oral     SpO2 03/07/17 1638 100 %     Weight 03/07/17 1639 155 lb (70.3 kg)  Height 03/07/17 1639 5' (1.524 m)     Head Circumference --      Peak Flow --      Pain Score --      Pain Loc --      Pain Edu? --      Excl. in GC? --     Constitutional: Alert and oriented. Well appearing and in no acute distress. Eyes: Conjunctivae are normal.  Head: Atraumatic. Nose: No congestion/rhinnorhea. Mouth/Throat: Mucous membranes are moist.   Neck: Normal range of motion.  Cardiovascular: Normal rate, regular rhythm. Grossly normal  heart sounds.  Good peripheral circulation. Respiratory: Normal respiratory effort.  No retractions. Lungs CTAB. Gastrointestinal: Soft and nontender. No distention.  Genitourinary: No CVA tenderness. Musculoskeletal: No lower extremity edema.  Extremities warm and well perfused. No calf swelling or tenderness. Neurologic:  Normal speech and language. No gross focal neurologic deficits are appreciated.  Skin:  Skin is warm and dry. No rash noted. Psychiatric: Mood and affect are normal. Speech and behavior are normal.  ____________________________________________   LABS (all labs ordered are listed, but only abnormal results are displayed)  Labs Reviewed  CBC - Abnormal; Notable for the following:       Result Value   RDW 15.2 (*)    All other components within normal limits  TSH - Abnormal; Notable for the following:    TSH 4.627 (*)    All other components within normal limits  TROPONIN I  COMPREHENSIVE METABOLIC PANEL  FIBRIN DERIVATIVES D-DIMER ([facility] ONLY)   ____________________________________________  EKG  ED ECG REPORT I, Dionne Bucy, the attending physician, personally viewed and interpreted this ECG.  Date: 03/07/2017 EKG Time: 1639 Rate: 83 Rhythm: normal sinus rhythm QRS Axis: normal Intervals: normal ST/T Wave abnormalities: T-wave inversion V2 likely lead placement Narrative Interpretation: unremarkable  ____________________________________________  RADIOLOGY  Chest x-ray clear with no acute findings.  ____________________________________________   PROCEDURES  Procedure(s) performed: No    Critical Care performed: No ____________________________________________   INITIAL IMPRESSION / ASSESSMENT AND PLAN / ED COURSE  Pertinent labs & imaging results that were available during my care of the patient were reviewed by me and considered in my medical decision making (see chart for details).  46 year old female presents with intermittent  palpitations over the last week and worse in the last day when she had some associated shortness of breath during the palpitations. No lightheadedness or near syncope.No prior history of this symptom. She is currently asymptomatic. Vital signs in the ED are normal, patient is extremely well-appearing, and her physical exam is unremarkable. Patient's EKG is also unremarkable.no acute findings on initial labs. Low suspicion for acute arrhythmia.  Pharyngeal also includes dehydration, viral syndrome, electrolyte abnormality, or other benign cause. Low suspicion for hyperthyroidism. Patient has no specific PE risk factors but the commissure palpitations and shortness of breath is slightly concerning. Plan: Basic labs, TSH, d-dimer, and reassess. Despite discharge home with primary care follow-up if negative. Patient will likely need outpatient Holter monitor.    ----------------------------------------- 10:20 PM on 03/07/2017 -----------------------------------------  Workup negative except for TSH which was upper limit of normal.she states hers runs normally lower around 2. It is possible that this is cause of patient's symptoms, however patient does not appear to be any acute thyroid crisis. Vital signs remained normal and she remains comfortable appearing. Patient feels well to go home. Return precautions given. She will follow-up with her endocrinologist with a phone call tomorrow.  ____________________________________________   FINAL CLINICAL IMPRESSION(S) /  ED DIAGNOSES  Final diagnoses:  Palpitations  Thyroid disease      NEW MEDICATIONS STARTED DURING THIS VISIT:  Discharge Medication List as of 03/07/2017 10:23 PM       Note:  This document was prepared using Dragon voice recognition software and may include unintentional dictation errors.    Dionne Bucy, MD 03/08/17 0030

## 2017-03-18 ENCOUNTER — Ambulatory Visit (INDEPENDENT_AMBULATORY_CARE_PROVIDER_SITE_OTHER): Payer: BC Managed Care – PPO | Admitting: Obstetrics and Gynecology

## 2017-03-18 ENCOUNTER — Encounter: Payer: Self-pay | Admitting: Obstetrics and Gynecology

## 2017-03-18 VITALS — BP 116/67 | HR 58 | Ht 65.0 in | Wt 153.0 lb

## 2017-03-18 DIAGNOSIS — Z3169 Encounter for other general counseling and advice on procreation: Secondary | ICD-10-CM

## 2017-03-18 DIAGNOSIS — Z7689 Persons encountering health services in other specified circumstances: Secondary | ICD-10-CM

## 2017-03-18 DIAGNOSIS — Z8759 Personal history of other complications of pregnancy, childbirth and the puerperium: Secondary | ICD-10-CM | POA: Diagnosis not present

## 2017-03-18 NOTE — Progress Notes (Signed)
Subjective:     Tracy CobbStephanie Gillespie is a 46 y.o. [id number] female here to establish care.  Last annual exam was [facility] December 2017 [facility]. Current complaints: patient desires to discuss her fertility options.  States that she was recently pregnant in March 2018 with twin pregnancy.  States that she did not find out that it was a miscarriage until almost 1 month later when an ultrasound was done (diagnosed with missed Ab x2). Notes that she tried Cytotec, but failed.  Had to have a D&C in May.  Notes she then had to have a second procedure in May for retained products, after she continued to have bleeding after her D&C.  Patient notes that her first living child was born ~ 4 years ago.  Required ovulation induction meds (Clomid).  Underwent 3 cycles, with no pregnacy, and then spontaneously conceived the month after off the Clomid.  Had desired to discuss her fertility options with her previous GYN, but notes that she was just told that she was "too old to conceive" and was brushed off.       Gynecologic History Patient's last menstrual period was 03/06/2017. Contraception: none Last Pap: 12/115/2017. Results were: normal Last mammogram: 08/04/2014. Results were: normal  Obstetric History OB History  Gravida Para Term Preterm AB Living  3 1 1   2 1   SAB TAB Ectopic Multiple Live Births          1    # Outcome Date GA Lbr Len/2nd Weight Sex Delivery Anes PTL Lv  3 Term     M Vag-Spont   LIV  2 AB           1 AB              G3 - patient had 4 years ago, no problems during labor, retained placenta postpartum (had D&C 10 weeks postpartum).  Did have hypothyroidism.  G4 - 09/2016 has miscarriage of twins (6 weeks, but not diagnosed until 11 weeks).    Past Medical History:  Diagnosis Date  . Miscarriage   . Seasonal allergies   . Thyroid disease    OB History  Gravida Para Term Preterm AB Living  3 1 1   2 1   SAB TAB Ectopic Multiple Live Births          1    # Outcome  Date GA Lbr Len/2nd Weight Sex Delivery Anes PTL Lv  3 Term     M Vag-Spont   LIV  2 AB           1 AB               Family History  Problem Relation Age of Onset  . Breast cancer Neg Hx   . Ovarian cancer Neg Hx   . Stroke Neg Hx   . Osteoarthritis Neg Hx     Past Surgical History:  Procedure Laterality Date  . DILATION AND EVACUATION N/A 12/09/2016   Procedure: DILATATION AND EVACUATION;  Surgeon: Christeen DouglasBeasley, Bethany, MD;  Location: [facility] ORS;  Service: Gynecology;  Laterality: N/A;  . TONSILLECTOMY      Social History   Social History  . Marital status: Married    Spouse name: N/A  . Number of children: N/A  . Years of education: N/A   Occupational History  . Not on file.   Social History Main Topics  . Smoking status: Never Smoker  . Smokeless tobacco: Never Used  . Alcohol use No  .  Drug use: No  . Sexual activity: Yes    Birth control/ protection: None   Other Topics Concern  . Not on file   Social History Narrative  . No narrative on file    Current Outpatient Prescriptions on File Prior to Visit  Medication Sig Dispense Refill  . azelastine (ASTELIN) 0.1 % nasal spray Place into both nostrils 2 (two) times daily. Use in each nostril as directed    . fluticasone (FLONASE) 50 MCG/ACT nasal spray Place into both nostrils daily.    . Levothyroxine Sodium 88 MCG CAPS Take 88 mcg by mouth daily before breakfast.     . Multiple Vitamin (MULTIVITAMIN) tablet Take 1 tablet by mouth daily.     No current facility-administered medications on file prior to visit.     No Known Allergies   Review of Systems Pertinent items noted in HPI and remainder of comprehensive ROS otherwise negative.    Objective:    BP 116/67 (BP Location: Left Arm, Patient Position: Sitting, Cuff Size: Normal)   Pulse (!) 58   Ht 5\' 5"  (1.651 m)   Wt 153 lb (69.4 kg)   LMP 03/06/2017   BMI 25.46 kg/m  General appearance: alert and no distress Neck: no adenopathy, no carotid  bruit, no JVD, supple, symmetrical, trachea midline and thyroid not enlarged, symmetric, no tenderness/mass/nodules Lungs: clear to auscultation bilaterally Heart: regular rate and rhythm, S1, S2 normal, no murmur, click, rub or gallop Abdomen: soft, non-tender; bowel sounds normal; no masses,  no organomegaly Pelvic: deferred    Assessment:   Encounter to establish care Advanced maternal age History of miscarriage Desiring fertility  Plan:   - Discussion had with patient regarding age and fertility.  Informed of decrease in ovarian reserve as age increases, as well as increased risk for genetic problems (miscarriages, Down's syndrome, etc).  She has however had a recent pregnancy with miscarriage.  Advised on obtaining labs to assess patient's fertility status. If ovarian reserve still ok, can discuss a short trial of Clomid again.  If levels are not optimal, or if short trial (3 months) of Clomid fails,  will need immediate referral to [facility] specialist.  Patient notes understanding.  - Will return on Day 21 for labs.    Hildred Laser, MD [facility]

## 2017-03-25 ENCOUNTER — Other Ambulatory Visit: Payer: BC Managed Care – PPO

## 2017-03-29 LAB — FSH/LH
FSH: 18.7 m[IU]/mL
LH: 32.5 m[IU]/mL

## 2017-03-29 LAB — ESTRADIOL: Estradiol: 99.7 pg/mL

## 2017-03-29 LAB — ANTI MULLERIAN HORMONE: ANTI-MULLERIAN HORMONE (AMH): 0.129 ng/mL

## 2017-03-29 LAB — PROGESTERONE: PROGESTERONE: 1.5 ng/mL

## 2017-03-31 ENCOUNTER — Encounter: Payer: Self-pay | Admitting: Obstetrics and Gynecology

## 2017-03-31 ENCOUNTER — Telehealth: Payer: Self-pay | Admitting: Obstetrics and Gynecology

## 2017-03-31 NOTE — Telephone Encounter (Signed)
Patient called wanting to speak with a nurse in regards to her bloodwork results. Please advise.

## 2017-04-05 NOTE — Telephone Encounter (Signed)
Please advise. Did inform pt last week via mychart that you were out of office.

## 2017-04-06 NOTE — Telephone Encounter (Signed)
I saw in a results message that Dr. Logan Bores did last week that said to have her scheduled for this week to discuss her results, but I'm guessing from this message that she has not yet been scheduled.  Her estrogen and progesterone levels were ok, but her AMH (which took longer to come back), was noted to be a little low (a measure of her ovarian function and reserve).  Overall I think she may benefit from a referral to a reproductive specialist, however I am not opposed to doing a short trial of Femara or Clomid for 3-4 months prior to that, as it worked for her in the past.

## 2017-04-07 NOTE — Telephone Encounter (Signed)
Called pt no answer. LM for pt to call back. Will also send my chart message.

## 2017-04-23 ENCOUNTER — Ambulatory Visit (INDEPENDENT_AMBULATORY_CARE_PROVIDER_SITE_OTHER): Payer: BC Managed Care – PPO | Admitting: Obstetrics and Gynecology

## 2017-04-23 ENCOUNTER — Encounter: Payer: Self-pay | Admitting: Obstetrics and Gynecology

## 2017-04-23 VITALS — BP 108/69 | HR 71 | Ht 65.0 in | Wt 152.9 lb

## 2017-04-23 DIAGNOSIS — Z319 Encounter for procreative management, unspecified: Secondary | ICD-10-CM | POA: Diagnosis not present

## 2017-04-23 DIAGNOSIS — N979 Female infertility, unspecified: Secondary | ICD-10-CM

## 2017-04-23 MED ORDER — LETROZOLE 2.5 MG PO TABS: 2.5000 mg | ORAL_TABLET | Freq: Every day | ORAL | 0 refills | Status: DC

## 2017-04-23 MED ORDER — METFORMIN HCL 500 MG PO TABS: ORAL_TABLET | ORAL | 5 refills | Status: DC

## 2017-04-23 NOTE — Patient Instructions (Addendum)
Letrozole tablets What is this medicine? LETROZOLE (LET roe zole) blocks the production of estrogen. It is used to treat breast cancer. This medicine may be used for other purposes; ask your health care provider or pharmacist if you have questions. COMMON BRAND NAME(S): Femara What should I tell my health care provider before I take this medicine? They need to know if you have any of these conditions: -high cholesterol -liver disease -osteoporosis (weak bones) -an unusual or allergic reaction to letrozole, other medicines, foods, dyes, or preservatives -pregnant or trying to get pregnant -breast-feeding How should I use this medicine? Take this medicine by mouth with a glass of water. You may take it with or without food. Follow the directions on the prescription label. Take your medicine at regular intervals. Do not take your medicine more often than directed. Do not stop taking except on your doctor's advice. Talk to your pediatrician regarding the use of this medicine in children. Special care may be needed. Overdosage: If you think you have taken too much of this medicine contact a poison control center or emergency room at once. NOTE: This medicine is only for you. Do not share this medicine with others. What if I miss a dose? If you miss a dose, take it as soon as you can. If it is almost time for your next dose, take only that dose. Do not take double or extra doses. What may interact with this medicine? Do not take this medicine with any of the following medications: -estrogens, like hormone replacement therapy or birth control pills This medicine may also interact with the following medications: -dietary supplements such as androstenedione or DHEA -prasterone -tamoxifen This list may not describe all possible interactions. Give your health care provider a list of all the medicines, herbs, non-prescription drugs, or dietary supplements you use. Also tell them if you smoke, drink  alcohol, or use illegal drugs. Some items may interact with your medicine. What should I watch for while using this medicine? Tell your doctor or healthcare professional if your symptoms do not start to get better or if they get worse. Do not become pregnant while taking this medicine or for 3 weeks after stopping it. Women should inform their doctor if they wish to become pregnant or think they might be pregnant. There is a potential for serious side effects to an unborn child. Talk to your health care professional or pharmacist for more information. Do not breast-feed while taking this medicine or for 3 weeks after stopping it. This medicine may interfere with the ability to have a child. Talk with your doctor or health care professional if you are concerned about your fertility. Using this medicine for a long time may increase your risk of low bone mass. Talk to your doctor about bone health. You may get drowsy or dizzy. Do not drive, use machinery, or do anything that needs mental alertness until you know how this medicine affects you. Do not stand or sit up quickly, especially if you are an older patient. This reduces the risk of dizzy or fainting spells. You may need blood work done while you are taking this medicine. What side effects may I notice from receiving this medicine? Side effects that you should report to your doctor or health care professional as soon as possible: -allergic reactions like skin rash, itching, or hives -bone fracture -chest pain -signs and symptoms of a blood clot such as breathing problems; changes in vision; chest pain; severe, sudden headache; pain, swelling,   warmth in the leg; trouble speaking; sudden numbness or weakness of the face, arm or leg -vaginal bleeding Side effects that usually do not require medical attention (report to your doctor or health care professional if they continue or are bothersome): -bone, back, joint, or muscle  pain -dizziness -fatigue -fluid retention -headache -hot flashes, night sweats -nausea -weight gain This list may not describe all possible side effects. Call your doctor for medical advice about side effects. You may report side effects to FDA at 1-800-FDA-1088. Where should I keep my medicine? Keep out of the reach of children. Store between 15 and 30 degrees C (59 and 86 degrees F). Throw away any unused medicine after the expiration date. NOTE: This sheet is a summary. It may not cover all possible information. If you have questions about this medicine, talk to your doctor, pharmacist, or health care provider.  2018 Elsevier/Gold Standard (2016-02-10 11:10:41)  

## 2017-04-23 NOTE — Progress Notes (Signed)
GYNECOLOGY PROGRESS NOTE  Subjective:    Patient ID: Tracy Gillespie, female    DOB: 10/08/1970, 46 y.o.   MRN: [id number]  HPI  Patient is a 46 y.o. [id number] female who presents for further discussion of infertility.  Currently denies complaints today.   The following portions of the patient's history were reviewed and updated as appropriate: allergies, current medications, past family history, past medical history, past social history, past surgical history and problem list.  Review of Systems Pertinent items noted in HPI and remainder of comprehensive ROS otherwise negative.   Objective:   Blood pressure 108/69, pulse 71, height  (1.651 m), weight 152 lb 14.4 oz (69.4 kg), last menstrual period 04/09/2017. General appearance: alert and no distress Remainder of exam deferred.    Labs:  Office Visit on 03/18/2017  Component Date Value Ref Range Status  . Progesterone 03/25/2017 1.5  ng/mL Final   Comment:                      Follicular phase       0.1 -   0.9                      Luteal phase           1.8 -  23.9                      Ovulation phase        0.1 -  12.0                      Pregnant                         First trimester    11.0 -  44.3                         Second trimester   25.4 -  83.3                         Third trimester    58.7 - 214.0                      Postmenopausal         0.0 -   0.1   . LH 03/25/2017 32.5  mIU/mL Final   Comment:                     Adult Female:                       Follicular phase      2.4 -  12.6                       Ovulation phase      14.0 -  95.6                       Luteal phase          1.0 -  11.4                       Postmenopausal        7.7 -  58.5   . FSH 03/25/2017 18.7  mIU/mL Final  Comment:                     Adult Female:                       Follicular phase      3.5 -  12.5                       Ovulation phase       4.7 -  21.5                       Luteal phase          1.7 -    7.7                       Postmenopausal       25.8 - 134.8   . ANTI-MULLERIAN HORMONE (AMH) 03/25/2017 0.129  ng/mL Final   Comment: For assays employing antibodies, the possibility exists for interference by heterophile antibodies in the samples.1 1. Kricka L.  Interferences in Immunoassays - still a threat.  Clin. Chem. 2000; 46: 4098-1191. Reference Range: Females 41 - 46y: 0.26 - 5.81 Median 0.58 AMH concentrations of >= 1.06 ng/mL is correlated with a better response to ovarian stimulation, produced more retrievable oocytes and higher odds of live birth according to Gleicher et al.  Garlon Hatchet and Sterility. 2010: 47:8295-6213.  The current AMH test method correlates with the study method with a slope of 0.94. Females at risk of ovarian hyperstimulation syndrome or polycystic ovarian syndrome (PCOS) may exhibit elevated serum AMH concentrations.   AMH levels from PCOS patients may be 2 to 5 fold higher than age-appropriate reference interval values. Granulosa cell tumors of the ovary may secrete AMH along with other tumor markers.  Elevated AMH is not specific for malignancy, and the assay sh                          ould not be used exclusively to diagnose or exclude an AMH-secreting ovarian tumor.   . Estradiol 03/25/2017 99.7  pg/mL Final   Comment:                     Adult Female:                       Follicular phase   12.5 -   166.0                       Ovulation phase    85.8 -   498.0                       Luteal phase       43.8 -   211.0                       Postmenopausal     <6.0 -    54.7                     Pregnancy                       1st trimester     215.0 - >4300.0  Girls (1-10 years)    6.0 -    27.0 Roche ECLIA methodology      Assessment:   Infertility management  Plan:   - Further discussion had regarding labs and current fertility status. Based on labs and history, patient is still ovulating regularly, however her  ovarian reserve is decreased.  Had previously had discussion on Mychart that greatest chances for successful pregnancy would likely be through an REI specialist.  Patient notes that her last pregnancy ~ 3 years ago was conceived after taking Clomid for 3 months. Patient notes that she understands the factors against her (age, decreased ovarian reserve), however would still like to try ovulation induction.  Notes that if she has no success, she and her husband will then consider whether or not to pursue further efforts with a specialist or stop trying all together.   - Will prescribe Femara.  Also will prescribe Metformin to help in conjunction with Femara.  Discussed risks, side effects.  Patient will use fertility kits to assess for ovulation and most fertile time.  Discussed timed coitus. Will start at 2.5 mg of Femara, and then increase to 5 mg after 3 months if no conception. To f/u in 6 months.    A total of 15 minutes were spent face-to-face with the patient during this encounter and over half of that time dealt with counseling and coordination of care.  Hildred Laser, MD [facility]

## 2017-08-05 ENCOUNTER — Other Ambulatory Visit: Payer: Self-pay | Admitting: Obstetrics and Gynecology

## 2017-08-05 ENCOUNTER — Encounter: Payer: Self-pay | Admitting: Obstetrics and Gynecology

## 2017-08-05 MED ORDER — LETROZOLE 2.5 MG PO TABS: 5.0000 mg | ORAL_TABLET | Freq: Every day | ORAL | 0 refills | Status: DC

## 2017-09-01 ENCOUNTER — Ambulatory Visit: Payer: Self-pay | Attending: Internal Medicine | Admitting: Occupational Therapy

## 2017-09-01 DIAGNOSIS — M25621 Stiffness of right elbow, not elsewhere classified: Secondary | ICD-10-CM | POA: Insufficient documentation

## 2017-09-01 NOTE — Therapy (Signed)
[facility] [address] [place], [place], [address] Phone: [phone]   Fax:  [phone]  Occupational Therapy Screen   Patient Details  Name: Tracy Gillespie MRN: [id number] Date of Birth: [birth year] No Data Recorded  Encounter Date: 09/01/2017    Past Medical History:  Diagnosis Date  . Miscarriage   . Seasonal allergies   . Thyroid disease     Past Surgical History:  Procedure Laterality Date  . DILATION AND EVACUATION N/A 12/09/2016   Procedure: DILATATION AND EVACUATION;  Surgeon: Christeen DouglasBeasley, Bethany, MD;  Location: [facility] ORS;  Service: Gynecology;  Laterality: N/A;  . TONSILLECTOMY      There were no vitals filed for this visit.  Subjective Assessment - 09/01/17 2204    Subjective   I helped my husband clean my sons room - all the toys and got new big boy bed - in but did not had any issues for about 4-5 days - but then I woke up one morning with my arm all the ways straight and it hurt some that day and felt really stiff - and feels these small shooting pains - did what you taught me - heat, massage , ice and stretches     Patient Stated Goals  Just wanted you to check it out - and make sure it is okay     Currently in Pain?  No/denies     Pt has no tenderness over R lateral epicondyle  Use Graston tool nr 2 for sweeping and brushing over dorsal forearm and lateral epicondyle   not tenderness  Negative for resistance to 3rd digit and wrist extention   no pain with resistance to supination and pronation   Grip same with hand to side and extended arm  See flow sheet    Grip strength L 66, R 65 - extended arm 63 lbs  Same than in June 2018   Pt to cont for 2 wks with heat , massage , stretches and ice  And if no issues stop - can phone me in 2 wks if needed                                Visit Diagnosis: Stiffness of right elbow, not elsewhere  classified    Problem List There are no active problems to display for this patient.   Oletta CohnuPreez, Sylina Henion OTR/L,CLT 09/01/2017, 10:07 PM  [facility] [address] [place], [place], [address] Phone: [phone]   Fax:  [phone]  Name: Tracy Gillespie MRN: [id number] Date of Birth: [birth year]

## 2017-10-21 ENCOUNTER — Ambulatory Visit: Payer: BC Managed Care – PPO | Admitting: Obstetrics and Gynecology

## 2017-10-21 ENCOUNTER — Encounter: Payer: Self-pay | Admitting: Obstetrics and Gynecology

## 2017-10-21 VITALS — BP 125/74 | HR 73 | Ht 65.0 in | Wt 147.3 lb

## 2017-10-21 DIAGNOSIS — Z1231 Encounter for screening mammogram for malignant neoplasm of breast: Secondary | ICD-10-CM | POA: Diagnosis not present

## 2017-10-21 DIAGNOSIS — E2839 Other primary ovarian failure: Secondary | ICD-10-CM

## 2017-10-21 DIAGNOSIS — Z319 Encounter for procreative management, unspecified: Secondary | ICD-10-CM | POA: Diagnosis not present

## 2017-10-21 DIAGNOSIS — N838 Other noninflammatory disorders of ovary, fallopian tube and broad ligament: Secondary | ICD-10-CM

## 2017-10-21 MED ORDER — METFORMIN HCL 500 MG PO TABS: ORAL_TABLET | ORAL | 11 refills | Status: DC

## 2017-10-21 NOTE — Progress Notes (Signed)
Pt is doing well no concerns.  

## 2017-10-21 NOTE — Progress Notes (Signed)
    GYNECOLOGY PROGRESS NOTE  Subjective:    Patient ID: Tracy Gillespie, female    DOB: 1971/04/17, 47 y.o.   MRN: [id number]  HPI  Patient is a 47 y.o. 233P1021 female who presents for 6 month f/u of infertility.  She has been using Metformin and Femara (high dose) for ovulation induction.  Has been using fertility kits which do note that she is ovulating.  Denies complaints otday.   Of note, patient notes that she has always had problems with her blood sugars (episodes of hypoglycemia with shaking and feeling queasy) but since starting the Metformin, she no longer has these episodes.  Desires to continue on the Metformin.   The following portions of the patient's history were reviewed and updated as appropriate: allergies, current medications, past family history, past medical history, past social history, past surgical history and problem list.  Review of Systems Pertinent items noted in HPI and remainder of comprehensive ROS otherwise negative.   Objective:   Blood pressure 125/74, pulse 73, height 5\' 5"  (1.651 m), weight 147 lb 4.8 oz (66.8 kg), last menstrual period 10/05/2017. General appearance: alert and no distress Remainder of exam deferred.    Assessment:   Infertility Advanced maternal age Breast cancer screening  Plan:   - Discussion had with patient again regarding her infertility, including age, decreased ovarian reserve.  Discussed if patient still desires to attempt conception at this time, which she does.  Given options, including taking a 2-3 month break from ovulation medications, and resuming again for another trial.  Would switch from Femara to Clomid.  Also discussed referral to a fertility center where she may be able to undergo more advanced fertility treatments. Patient hesitant due to cost, but advised that she could at least be seen for consultation and make a more informed decision. Patient notes she will consider it.  She reports that the last time  after stopping the Clomid after 3 months of use that she conceived naturally within the following month. She is hopeful that the same will occur this time.  - Patient to f/u in 3 months. Will also be due for an annual exam at that time. Will go ahead and order mammogram today as she is past due.    A total of 15 minutes were spent face-to-face with the patient during this encounter and over half of that time dealt with counseling and coordination of care.   Hildred Laserherry, Sheril Hammond, MD [facility]

## 2017-10-24 ENCOUNTER — Encounter: Payer: Self-pay | Admitting: Obstetrics and Gynecology

## 2017-12-28 ENCOUNTER — Encounter: Payer: BC Managed Care – PPO | Admitting: Obstetrics and Gynecology

## 2018-01-07 ENCOUNTER — Encounter: Payer: Self-pay | Admitting: Obstetrics and Gynecology

## 2018-01-07 ENCOUNTER — Ambulatory Visit (INDEPENDENT_AMBULATORY_CARE_PROVIDER_SITE_OTHER): Payer: BC Managed Care – PPO | Admitting: Obstetrics and Gynecology

## 2018-01-07 VITALS — BP 105/70 | HR 71 | Ht 64.0 in | Wt 140.1 lb

## 2018-01-07 DIAGNOSIS — Z8742 Personal history of other diseases of the female genital tract: Secondary | ICD-10-CM | POA: Diagnosis not present

## 2018-01-07 DIAGNOSIS — Z13220 Encounter for screening for lipoid disorders: Secondary | ICD-10-CM

## 2018-01-07 DIAGNOSIS — Z01419 Encounter for gynecological examination (general) (routine) without abnormal findings: Secondary | ICD-10-CM

## 2018-01-07 DIAGNOSIS — E039 Hypothyroidism, unspecified: Secondary | ICD-10-CM | POA: Diagnosis not present

## 2018-01-07 MED ORDER — CLOMIPHENE CITRATE 50 MG PO TABS: 50.0000 mg | ORAL_TABLET | Freq: Every day | ORAL | 3 refills | Status: DC

## 2018-01-07 NOTE — Progress Notes (Signed)
GYNECOLOGY ANNUAL EXAM CLINIC PROGRESS NOTE Subjective:     Tracy Gillespie is a 47 y.o. G36P1021 female here for annual exam. The patient has no complaints today. The patient is sexually active. The patient wears seatbelts: yes. The patient participates in regular exercise: yes. Has the patient ever been transfused or tattooed?: no. The patient reports that there is not domestic violence in her life.   The patient notes that she is ready to resume ovulation treatments for infertility.  Previously tried Femara for 3 months but then discontinued.  Also wonders if she should do this, or should she just be referred to a specialist.    Gynecologic History Patient's last menstrual period was 12/27/2017. Contraception: none Last Pap: 12/115/2017. Results were: normal Last mammogram: 08/04/2014. Results were: normal  Obstetric History OB History  Gravida Para Term Preterm AB Living  4 1 1   3 1   SAB TAB Ectopic Multiple Live Births          1    # Outcome Date GA Lbr Len/2nd Weight Sex Delivery Anes PTL Lv  4 AB 09/2016          3 Term     M Vag-Spont   LIV  2 AB           1 AB             Obstetric Comments  G3 - patient had 4 years ago, no problems during labor, retained placenta postpartum (had D&C 10 weeks postpartum).  Did have hypothyroidism.   G4 - 09/2016 has miscarriage of twins (6 weeks, but not diagnosed until 11 weeks).       Past Medical History:  Diagnosis Date  . Miscarriage   . Seasonal allergies   . Thyroid disease     Family History  Problem Relation Age of Onset  . Breast cancer Neg Hx   . Ovarian cancer Neg Hx   . Stroke Neg Hx   . Osteoarthritis Neg Hx     Past Surgical History:  Procedure Laterality Date  . DILATION AND EVACUATION N/A 12/09/2016   Procedure: DILATATION AND EVACUATION;  Surgeon: Christeen Douglas, MD;  Location: [facility] ORS;  Service: Gynecology;  Laterality: N/A;  . TONSILLECTOMY      Social History   Socioeconomic History  .  Marital status: Married    Spouse name: Not on file  . Number of children: Not on file  . Years of education: Not on file  . Highest education level: Not on file  Occupational History  . Not on file  Social Needs  . Financial resource strain: Not on file  . Food insecurity:    Worry: Not on file    Inability: Not on file  . Transportation needs:    Medical: Not on file    Non-medical: Not on file  Tobacco Use  . Smoking status: Never Smoker  . Smokeless tobacco: Never Used  Substance and Sexual Activity  . Alcohol use: No  . Drug use: No  . Sexual activity: Yes    Birth control/protection: None  Lifestyle  . Physical activity:    Days per week: Not on file    Minutes per session: Not on file  . Stress: Not on file  Relationships  . Social connections:    Talks on phone: Not on file    Gets together: Not on file    Attends religious service: Not on file    Active member of club or  organization: Not on file    Attends meetings of clubs or organizations: Not on file    Relationship status: Not on file  . Intimate partner violence:    Fear of current or ex partner: Not on file    Emotionally abused: Not on file    Physically abused: Not on file    Forced sexual activity: Not on file  Other Topics Concern  . Not on file  Social History Narrative  . Not on file    Current Outpatient Medications on File Prior to Visit  Medication Sig Dispense Refill  . azelastine (ASTELIN) 0.1 % nasal spray Place into both nostrils 2 (two) times daily. Use in each nostril as directed    . fluticasone (FLONASE) 50 MCG/ACT nasal spray Place into both nostrils daily.    . Levothyroxine Sodium 88 MCG CAPS Take 88 mcg by mouth daily before breakfast.     . metFORMIN (GLUCOPHAGE) 500 MG tablet Take one tablet by mouth daily for one week. Then increase to one tablet twice a day. 60 tablet 11  . Multiple Vitamin (MULTIVITAMIN) tablet Take 1 tablet by mouth daily.     No current  facility-administered medications on file prior to visit.     No Known Allergies   Review of Systems Constitutional: negative for chills, fatigue, fevers and sweats Eyes: negative for irritation, redness and visual disturbance Ears, nose, mouth, throat, and face: negative for hearing loss, nasal congestion, snoring and tinnitus Respiratory: negative for asthma, cough, sputum Cardiovascular: negative for chest pain, dyspnea, exertional chest pressure/discomfort, irregular heart beat, palpitations and syncope Gastrointestinal: negative for abdominal pain, change in bowel habits, nausea and vomiting Genitourinary: negative for abnormal menstrual periods, genital lesions, sexual problems and vaginal discharge, dysuria and urinary incontinence Integument/breast: negative for breast lump, breast tenderness and nipple discharge Hematologic/lymphatic: negative for bleeding and easy bruising Musculoskeletal:negative for back pain and muscle weakness Neurological: negative for dizziness, headaches, vertigo and weakness Endocrine: negative for diabetic symptoms including polydipsia, polyuria and skin dryness Allergic/Immunologic: negative for hay fever and urticaria     Objective:    BP 105/70   Pulse 71   Ht 5\' 4"  (1.626 m)   Wt 140 lb 1.6 oz (63.5 kg)   LMP 12/27/2017   BMI 24.05 kg/m   General Appearance:    Alert, cooperative, no distress, appears stated age  Head:    Normocephalic, without obvious abnormality, atraumatic  Eyes:    PERRL, conjunctiva/corneas clear, EOM's intact, both eyes  Ears:    External ear canals, both ears  Nose:   Nares normal, septum midline, mucosa normal, no drainage or sinus tenderness  Throat:   Lips, mucosa, and tongue normal; teeth and gums normal  Neck:   Supple, symmetrical, trachea midline, no adenopathy; thyroid:  no enlargement/tenderness/nodules; no carotid bruit or JVD  Back:     Symmetric, no curvature, ROM normal, no CVA tenderness  Lungs:      Clear to auscultation bilaterally, respirations unlabored  Chest Wall:    No tenderness or deformity   Heart:    Regular rate and rhythm, S1 and S2 normal, no murmur, rub or gallop  Breast Exam:    No tenderness, masses, or nipple abnormality  Abdomen:     Soft, non-tender, bowel sounds active all four quadrants, no masses, no organomegaly  Genitalia:    Normal female without lesion, discharge or tenderness  Rectal:    Normal tone, normal prostate, no masses or tenderness  Extremities:  Extremities normal, atraumatic, no cyanosis or edema,   Pulses:   2+ and symmetric all extremities  Skin:   Skin color, texture, turgor normal, no rashes or lesions  Lymph nodes:   Cervical, supraclavicular, and axillary nodes normal  Neurologic:   CNII-XII intact, normal strength, sensation and reflexes throughout     Labs:  Lab Results  Component Value Date   WBC 7.6 03/07/2017   HGB 12.5 03/07/2017   HCT 36.7 03/07/2017   MCV 86.8 03/07/2017   PLT 290 03/07/2017    Lab Results  Component Value Date   CREATININE 0.66 03/07/2017   BUN 10 03/07/2017   NA 136 03/07/2017   K 3.6 03/07/2017   CL 103 03/07/2017   CO2 27 03/07/2017    Lab Results  Component Value Date   ALT 20 03/07/2017   AST 26 03/07/2017   ALKPHOS 54 03/07/2017   BILITOT 0.4 03/07/2017    Lab Results  Component Value Date   TSH 4.627 (H) 03/07/2017     No results found for: CHOL, TRIG, HDL, CHOLHDL, VLDL, LDLCALC, LDLDIRECT   No results found for: HGBA1C   Assessment:   Routine gynecologic exam Advanced maternal age  History of miscarriage Hypothyroidism H/o infertility  Plan:   Labs: CBC, CMP, Thyroid panel, Lipid panel. To be performed in August Contraception: none. Desiring to conceive Mammogram ordered. Discussed healthy lifestyle interventions. Pap smear performed today.  Patient desires to resume Clomid for ovulation induction. Clomid prescription given. Also will place referral to Infertility  specialist.  Follow up in: 4 months for f/u fertility Follow up in 1 year for annual exam.    Hildred Laserherry, Enes Rokosz, MD [facility]

## 2018-01-07 NOTE — Progress Notes (Signed)
Pt stated that she is doing well no concerns.  

## 2018-01-08 ENCOUNTER — Encounter: Payer: Self-pay | Admitting: Obstetrics and Gynecology

## 2018-01-14 LAB — IGP, COBASHPV16/18
HPV 16: NEGATIVE
HPV 18: NEGATIVE
HPV other hr types: NEGATIVE
PAP SMEAR COMMENT: 0

## 2018-01-27 ENCOUNTER — Ambulatory Visit: Payer: Self-pay | Admitting: Occupational Therapy

## 2018-02-02 ENCOUNTER — Ambulatory Visit
Admission: RE | Admit: 2018-02-02 | Discharge: 2018-02-02 | Disposition: A | Discharge status: Home / Self Care | Payer: BC Managed Care – PPO | Source: Ambulatory Visit | Attending: Obstetrics and Gynecology | Admitting: Obstetrics and Gynecology

## 2018-02-02 DIAGNOSIS — Z1231 Encounter for screening mammogram for malignant neoplasm of breast: Secondary | ICD-10-CM | POA: Diagnosis present

## 2018-02-07 ENCOUNTER — Ambulatory Visit: Payer: BC Managed Care – PPO | Attending: Orthopedic Surgery | Admitting: Occupational Therapy

## 2018-02-07 DIAGNOSIS — M6281 Muscle weakness (generalized): Secondary | ICD-10-CM | POA: Insufficient documentation

## 2018-02-07 DIAGNOSIS — M25621 Stiffness of right elbow, not elsewhere classified: Secondary | ICD-10-CM | POA: Insufficient documentation

## 2018-02-07 NOTE — Therapy (Signed)
[facility] [address] [place], [address] Phone: [phone]   Fax:  [phone]  Occupational Therapy Screen  Patient Details  Name: Tracy Gillespie MRN: [id number] Date of Birth: 31-Jul-1970 No data recorded  Encounter Date: 02/07/2018    Past Medical History:  Diagnosis Date  . Miscarriage   . Seasonal allergies   . Thyroid disease     Past Surgical History:  Procedure Laterality Date  . DILATION AND EVACUATION N/A 12/09/2016   Procedure: DILATATION AND EVACUATION;  Surgeon: Christeen Douglas, MD;  Location: [facility] ORS;  Service: Gynecology;  Laterality: N/A;  . TONSILLECTOMY      There were no vitals filed for this visit.  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  No/denies         [facility] OT Assessment - 02/07/18 0001      Strength   Right Hand Grip (lbs)  65 extended arm 55    Right Hand Lateral Pinch  18 lbs    Right Hand 3 Point Pinch  12 lbs    Left Hand Grip (lbs)  64 Extended arm 65    Left Hand Lateral Pinch  16 lbs    Left Hand 3 Point Pinch  13 lbs        Pt has still little tenderness over R lateral epicondyle  - palpation  Negative for resistance to 3rd digit and wrist extention - no pain   no pain with resistance to supination and pronation   Grip still same at side - and drop 10 lbs with extended arm   but probably because of not doing strengthening at shoulder or upper back on R     Grip strength L 65, R 64 - extended arm 55 on R   Same than in June 2018   Pt to cont  with heat , massage , stretches and ice  As needed  And if no issues stop    Pt can start some light weight in pool during class- but start low weight and reps   increase gradually - and one thing at time. Avoid wrist extention and tight grip on weights  but can do shoulder and upper back exercises                      OT Short Term Goals - 03/13/16 1205      OT SHORT TERM GOAL #1   Title  Pain on PRTEE improve with at leat 10 points     Status  Achieved      OT SHORT TERM GOAL #2   Title  Pt to show decrease stiffness in am and night time and decrease discomfort with stretches of wrist extensors     Status  Achieved        OT Long Term Goals - 03/13/16 1206      OT LONG TERM GOAL #1   Title  Function improve on PRTEE by at least 10-12 points in using R hand  Status  Achieved      OT LONG TERM GOAL #2   Title  R grip strength improve at least  5-8 lbs to lift and grabb objects with less than 6/10 pain     Baseline  grip decrease from 60 to 50 - and with extended arm 45 R , L 65     Time  6    Period  Weeks    Status  On-going              Patient will benefit from skilled therapeutic intervention in order to improve the following deficits and impairments:     Visit Diagnosis: Stiffness of right elbow, not elsewhere classified  Muscle weakness (generalized)    Problem List There are no active problems to display for this patient.   Oletta CohnuPreez, Duru Reiger OTR/L,CLT 02/07/2018, 10:27 AM  [facility] [address] [place], [place], [address] Phone: [phone]   Fax:  [phone]  Name: Tracy Gillespie MRN: [id number] Date of Birth: July 17, 1971

## 2018-03-04 ENCOUNTER — Other Ambulatory Visit: Payer: BC Managed Care – PPO

## 2018-03-04 DIAGNOSIS — Z01419 Encounter for gynecological examination (general) (routine) without abnormal findings: Secondary | ICD-10-CM

## 2018-03-04 NOTE — Addendum Note (Signed)
Addended by: Silvano BilisHAMPTON, Lorey Pallett L on: 03/04/2018 08:49 AM   Modules accepted: Orders

## 2018-03-04 NOTE — Addendum Note (Signed)
Addended by: Silvano BilisHAMPTON, Rielyn Krupinski L on: 03/04/2018 08:42 AM   Modules accepted: Orders

## 2018-03-04 NOTE — Addendum Note (Signed)
Addended by: Silvano BilisHAMPTON, Nasya Vincent L on: 03/04/2018 08:44 AM   Modules accepted: Orders

## 2018-03-05 LAB — CBC
HEMATOCRIT: 38.1 % (ref 34.0–46.6)
Hemoglobin: 12.9 g/dL (ref 11.1–15.9)
MCH: 30.3 pg (ref 26.6–33.0)
MCHC: 33.9 g/dL (ref 31.5–35.7)
MCV: 89 fL (ref 79–97)
Platelets: 305 10*3/uL (ref 150–450)
RBC: 4.26 x10E6/uL (ref 3.77–5.28)
RDW: 14.2 % (ref 12.3–15.4)
WBC: 5.2 10*3/uL (ref 3.4–10.8)

## 2018-03-05 LAB — LIPID PANEL
CHOL/HDL RATIO: 3.3 ratio (ref 0.0–4.4)
Cholesterol, Total: 252 mg/dL — ABNORMAL HIGH (ref 100–199)
HDL: 76 mg/dL (ref >39)
LDL Calculated: 165 mg/dL — ABNORMAL HIGH (ref 0–99)
Triglycerides: 57 mg/dL (ref 0–149)
VLDL Cholesterol Cal: 11 mg/dL (ref 5–40)

## 2018-03-05 LAB — COMPREHENSIVE METABOLIC PANEL
ALT: 13 IU/L (ref 0–32)
AST: 17 IU/L (ref 0–40)
Albumin/Globulin Ratio: 1.8 (ref 1.2–2.2)
Albumin: 4.4 g/dL (ref 3.5–5.5)
Alkaline Phosphatase: 49 IU/L (ref 39–117)
BUN/Creatinine Ratio: 13 (ref 9–23)
BUN: 9 mg/dL (ref 6–24)
Bilirubin Total: 0.4 mg/dL (ref 0.0–1.2)
CO2: 24 mmol/L (ref 20–29)
Calcium: 9.3 mg/dL (ref 8.7–10.2)
Chloride: 101 mmol/L (ref 96–106)
Creatinine, Ser: 0.71 mg/dL (ref 0.57–1.00)
GFR calc non Af Amer: 102 mL/min/{1.73_m2} (ref >59)
GFR, EST AFRICAN AMERICAN: 117 mL/min/{1.73_m2} (ref >59)
GLUCOSE: 79 mg/dL (ref 65–99)
Globulin, Total: 2.5 g/dL (ref 1.5–4.5)
Potassium: 4.4 mmol/L (ref 3.5–5.2)
Sodium: 139 mmol/L (ref 134–144)
TOTAL PROTEIN: 6.9 g/dL (ref 6.0–8.5)

## 2018-03-09 ENCOUNTER — Other Ambulatory Visit: Payer: BC Managed Care – PPO

## 2018-04-30 ENCOUNTER — Other Ambulatory Visit: Payer: Self-pay | Admitting: Obstetrics and Gynecology

## 2018-06-04 ENCOUNTER — Other Ambulatory Visit: Payer: Self-pay | Admitting: Obstetrics and Gynecology

## 2018-09-05 ENCOUNTER — Other Ambulatory Visit: Payer: Self-pay

## 2018-09-05 MED ORDER — METFORMIN HCL 500 MG PO TABS: ORAL_TABLET | ORAL | 0 refills | Status: DC

## 2018-09-07 ENCOUNTER — Other Ambulatory Visit: Payer: Self-pay | Admitting: Obstetrics and Gynecology

## 2018-09-07 MED ORDER — METFORMIN HCL 500 MG PO TABS: ORAL_TABLET | ORAL | 11 refills | Status: DC

## 2019-01-12 ENCOUNTER — Telehealth: Payer: Self-pay

## 2019-01-12 ENCOUNTER — Encounter: Payer: BC Managed Care – PPO | Admitting: Obstetrics and Gynecology

## 2019-01-12 NOTE — Progress Notes (Signed)
Pt is present today for annual exam. Pt stated that she was doing well. No problems.

## 2019-01-12 NOTE — Telephone Encounter (Signed)
Pt prescreened no symptoms has face mask.   Coronavirus (COVID-19) Are you at risk?  Are you at risk for the Coronavirus (COVID-19)?  To be considered HIGH RISK for Coronavirus (COVID-19), you have to meet the following criteria:  . Traveled to [place], [place], [place], [place] or [place]; or in the [place] to [place], [place], [place], or [place]; and have fever, cough, and shortness of breath within the last 2 weeks of travel OR . Been in close contact with a person diagnosed with COVID-19 within the last 2 weeks and have fever, cough, and shortness of breath . IF YOU DO NOT MEET THESE CRITERIA, YOU ARE CONSIDERED LOW RISK FOR COVID-19.  What to do if you are HIGH RISK for COVID-19?  . If you are having a medical emergency, call 911. . Seek medical care right away. Before you go to a doctor's office, urgent care or emergency department, call ahead and tell them about your recent travel, contact with someone diagnosed with COVID-19, and your symptoms. You should receive instructions from your physician's office regarding next steps of care.  . When you arrive at healthcare provider, tell the healthcare staff immediately you have returned from visiting [place], [place], [place], [place] or [place]; or traveled in the [place] to [place], [place], [place], or [place]; in the last two weeks or you have been in close contact with a person diagnosed with COVID-19 in the last 2 weeks.   . Tell the health care staff about your symptoms: fever, cough and shortness of breath. . After you have been seen by a medical provider, you will be either: o Tested for (COVID-19) and discharged home on quarantine except to seek medical care if symptoms worsen, and asked to  - Stay home and avoid contact with others until you get your results (4-5 days)  - Avoid travel on public transportation if possible (such as bus, train, or airplane) or o Sent to the Emergency Department by EMS for  evaluation, COVID-19 testing, and possible admission depending on your condition and test results.  What to do if you are LOW RISK for COVID-19?  Reduce your risk of any infection by using the same precautions used for avoiding the common cold or flu:  . Wash your hands often with soap and warm water for at least 20 seconds.  If soap and water are not readily available, use an alcohol-based hand sanitizer with at least 60% alcohol.  . If coughing or sneezing, cover your mouth and nose by coughing or sneezing into the elbow areas of your shirt or coat, into a tissue or into your sleeve (not your hands). . Avoid shaking hands with others and consider head nods or verbal greetings only. . Avoid touching your eyes, nose, or mouth with unwashed hands.  . Avoid close contact with people who are sick. . Avoid places or events with large numbers of people in one location, like concerts or sporting events. . Carefully consider travel plans you have or are making. . If you are planning any travel outside or inside the [place], visit the CDC's Travelers' Health webpage for the latest health notices. . If you have some symptoms but not all symptoms, continue to monitor at home and seek medical attention if your symptoms worsen. . If you are having a medical emergency, call 911.   ADDITIONAL HEALTHCARE OPTIONS FOR PATIENTS  [facility] / e-Visit: [email or web address]           [facility]: [phone]  [facility]: [phone]                   [facility]: [phone]  

## 2019-01-12 NOTE — Patient Instructions (Addendum)
Health Maintenance, Female Adopting a healthy lifestyle and getting preventive care can go a long way to promote health and wellness. Talk with your health care provider about what schedule of regular examinations is right for you. This is a good chance for you to check in with your provider about disease prevention and staying healthy. In between checkups, there are plenty of things you can do on your own. Experts have done a lot of research about which lifestyle changes and preventive measures are most likely to keep you healthy. Ask your health care provider for more information. Weight and diet Eat a healthy diet  Be sure to include plenty of vegetables, fruits, low-fat dairy products, and lean protein.  Do not eat a lot of foods high in solid fats, added sugars, or salt.  Get regular exercise. This is one of the most important things you can do for your health. ? Most adults should exercise for at least 150 minutes each week. The exercise should increase your heart rate and make you sweat (moderate-intensity exercise). ? Most adults should also do strengthening exercises at least twice a week. This is in addition to the moderate-intensity exercise. Maintain a healthy weight  Body mass index (BMI) is a measurement that can be used to identify possible weight problems. It estimates body fat based on height and weight. Your health care provider can help determine your BMI and help you achieve or maintain a healthy weight.  For females 5 years of age and older: ? A BMI below 18.5 is considered underweight. ? A BMI of 18.5 to 24.9 is normal. ? A BMI of 25 to 29.9 is considered overweight. ? A BMI of 30 and above is considered obese. Watch levels of cholesterol and blood lipids  You should start having your blood tested for lipids and cholesterol at 50 years of age, then have this test every 5 years.  You may need to have your cholesterol levels checked more often if: ? Your lipid or  cholesterol levels are high. ? You are older than 49 years of age. ? You are at high risk for heart disease. Cancer screening Lung Cancer  Lung cancer screening is recommended for adults 48-79 years old who are at high risk for lung cancer because of a history of smoking.  A yearly low-dose CT scan of the lungs is recommended for people who: ? Currently smoke. ? Have quit within the past 15 years. ? Have at least a 30-pack-year history of smoking. A pack year is smoking an average of one pack of cigarettes a day for 1 year.  Yearly screening should continue until it has been 15 years since you quit.  Yearly screening should stop if you develop a health problem that would prevent you from having lung cancer treatment. Breast Cancer  Practice breast self-awareness. This means understanding how your breasts normally appear and feel.  It also means doing regular breast self-exams. Let your health care provider know about any changes, no matter how small.  If you are in your 20s or 30s, you should have a clinical breast exam (CBE) by a health care provider every 1-3 years as part of a regular health exam.  If you are 22 or older, have a CBE every year. Also consider having a breast X-ray (mammogram) every year.  If you have a family history of breast cancer, talk to your health care provider about genetic screening.  If you are at high risk for breast cancer, talk  to your health care provider about having an MRI and a mammogram every year.  Breast cancer gene (BRCA) assessment is recommended for women who have family members with BRCA-related cancers. BRCA-related cancers include: ? Breast. ? Ovarian. ? Tubal. ? Peritoneal cancers.  Results of the assessment will determine the need for genetic counseling and BRCA1 and BRCA2 testing. Cervical Cancer Your health care provider may recommend that you be screened regularly for cancer of the pelvic organs (ovaries, uterus, and vagina).  This screening involves a pelvic examination, including checking for microscopic changes to the surface of your cervix (Pap test). You may be encouraged to have this screening done every 3 years, beginning at age 21.  For women ages 30-65, health care providers may recommend pelvic exams and Pap testing every 3 years, or they may recommend the Pap and pelvic exam, combined with testing for human papilloma virus (HPV), every 5 years. Some types of HPV increase your risk of cervical cancer. Testing for HPV may also be done on women of any age with unclear Pap test results.  Other health care providers may not recommend any screening for nonpregnant women who are considered low risk for pelvic cancer and who do not have symptoms. Ask your health care provider if a screening pelvic exam is right for you.  If you have had past treatment for cervical cancer or a condition that could lead to cancer, you need Pap tests and screening for cancer for at least 20 years after your treatment. If Pap tests have been discontinued, your risk factors (such as having a new sexual partner) need to be reassessed to determine if screening should resume. Some women have medical problems that increase the chance of getting cervical cancer. In these cases, your health care provider may recommend more frequent screening and Pap tests. Colorectal Cancer  This type of cancer can be detected and often prevented.  Routine colorectal cancer screening usually begins at 50 years of age and continues through 49 years of age.  Your health care provider may recommend screening at an earlier age if you have risk factors for colon cancer.  Your health care provider may also recommend using home test kits to check for hidden blood in the stool.  A small camera at the end of a tube can be used to examine your colon directly (sigmoidoscopy or colonoscopy). This is done to check for the earliest forms of colorectal cancer.  Routine  screening usually begins at age 50.  Direct examination of the colon should be repeated every 5-10 years through 49 years of age. However, you may need to be screened more often if early forms of precancerous polyps or small growths are found. Skin Cancer  Check your skin from head to toe regularly.  Tell your health care provider about any new moles or changes in moles, especially if there is a change in a mole's shape or color.  Also tell your health care provider if you have a mole that is larger than the size of a pencil eraser.  Always use sunscreen. Apply sunscreen liberally and repeatedly throughout the day.  Protect yourself by wearing long sleeves, pants, a wide-brimmed hat, and sunglasses whenever you are outside. Heart disease, diabetes, and high blood pressure  High blood pressure causes heart disease and increases the risk of stroke. High blood pressure is more likely to develop in: ? People who have blood pressure in the high end of the normal range (130-139/85-89 mm Hg). ? People   who are overweight or obese. ? People who are African American.  If you are 84-22 years of age, have your blood pressure checked every 3-5 years. If you are 67 years of age or older, have your blood pressure checked every year. You should have your blood pressure measured twice-once when you are at a hospital or clinic, and once when you are not at a hospital or clinic. Record the average of the two measurements. To check your blood pressure when you are not at a hospital or clinic, you can use: ? An automated blood pressure machine at a pharmacy. ? A home blood pressure monitor.  If you are between 52 years and 3 years old, ask your health care provider if you should take aspirin to prevent strokes.  Have regular diabetes screenings. This involves taking a blood sample to check your fasting blood sugar level. ? If you are at a normal weight and have a low risk for diabetes, have this test once  every three years after 49 years of age. ? If you are overweight and have a high risk for diabetes, consider being tested at a younger age or more often. Preventing infection Hepatitis B  If you have a higher risk for hepatitis B, you should be screened for this virus. You are considered at high risk for hepatitis B if: ? You were born in a country where hepatitis B is common. Ask your health care provider which countries are considered high risk. ? Your parents were born in a high-risk country, and you have not been immunized against hepatitis B (hepatitis B vaccine). ? You have HIV or AIDS. ? You use needles to inject street drugs. ? You live with someone who has hepatitis B. ? You have had sex with someone who has hepatitis B. ? You get hemodialysis treatment. ? You take certain medicines for conditions, including cancer, organ transplantation, and autoimmune conditions. Hepatitis C  Blood testing is recommended for: ? Everyone born from [birth year] through 1966. ? Anyone with known risk factors for hepatitis C. Sexually transmitted infections (STIs)  You should be screened for sexually transmitted infections (STIs) including gonorrhea and chlamydia if: ? You are sexually active and are younger than 49 years of age. ? You are older than 49 years of age and your health care provider tells you that you are at risk for this type of infection. ? Your sexual activity has changed since you were last screened and you are at an increased risk for chlamydia or gonorrhea. Ask your health care provider if you are at risk.  If you do not have HIV, but are at risk, it may be recommended that you take a prescription medicine daily to prevent HIV infection. This is called pre-exposure prophylaxis (PrEP). You are considered at risk if: ? You are sexually active and do not regularly use condoms or know the HIV status of your partner(s). ? You take drugs by injection. ? You are sexually active with a partner  who has HIV. Talk with your health care provider about whether you are at high risk of being infected with HIV. If you choose to begin PrEP, you should first be tested for HIV. You should then be tested every 3 months for as long as you are taking PrEP. Pregnancy  If you are premenopausal and you may become pregnant, ask your health care provider about preconception counseling.  If you may become pregnant, take 400 to 800 micrograms (mcg) of folic acid every  day.  If you want to prevent pregnancy, talk to your health care provider about birth control (contraception). Osteoporosis and menopause  Osteoporosis is a disease in which the bones lose minerals and strength with aging. This can result in serious bone fractures. Your risk for osteoporosis can be identified using a bone density scan.  If you are 65 years of age or older, or if you are at risk for osteoporosis and fractures, ask your health care provider if you should be screened.  Ask your health care provider whether you should take a calcium or vitamin D supplement to lower your risk for osteoporosis.  Menopause may have certain physical symptoms and risks.  Hormone replacement therapy may reduce some of these symptoms and risks. Talk to your health care provider about whether hormone replacement therapy is right for you. Follow these instructions at home:  Schedule regular health, dental, and eye exams.  Stay current with your immunizations.  Do not use any tobacco products including cigarettes, chewing tobacco, or electronic cigarettes.  If you are pregnant, do not drink alcohol.  If you are breastfeeding, limit how much and how often you drink alcohol.  Limit alcohol intake to no more than 1 drink per day for nonpregnant women. One drink equals 12 ounces of beer, 5 ounces of wine, or 1 ounces of hard liquor.  Do not use street drugs.  Do not share needles.  Ask your health care provider for help if you need support  or information about quitting drugs.  Tell your health care provider if you often feel depressed.  Tell your health care provider if you have ever been abused or do not feel safe at home. This information is not intended to replace advice given to you by your health care provider. Make sure you discuss any questions you have with your health care provider. Document Released: 01/19/2011 Document Revised: 12/12/2015 Document Reviewed: 04/09/2015 Elsevier Interactive Patient Education  2019 Elsevier Inc.   Breast Self-Awareness Breast self-awareness means:  Knowing how your breasts look.  Knowing how your breasts feel.  Checking your breasts every month for changes.  Telling your doctor if you notice a change in your breasts. Breast self-awareness allows you to notice a breast problem early while it is still small. How to do a breast self-exam One way to learn what is normal for your breasts and to check for changes is to do a breast self-exam. To do a breast self-exam: Look for Changes  1. Take off all the clothes above your waist. 2. Stand in front of a mirror in a room with good lighting. 3. Put your hands on your hips. 4. Push your hands down. 5. Look at your breasts and nipples in the mirror to see if one breast or nipple looks different than the other. Check to see if: ? The shape of one breast is different. ? The size of one breast is different. ? There are wrinkles, dips, and bumps in one breast and not the other. 6. Look at each breast for changes in your skin, such as: ? Redness. ? Scaly areas. 7. Look for changes in your nipples, such as: ? Liquid around the nipples. ? Bleeding. ? Dimpling. ? Redness. ? A change in where the nipples are. Feel for Changes 1. Lie on your back on the floor. 2. Feel each breast. To do this, follow these steps: ? Pick a breast to feel. ? Put the arm closest to that breast above your head. ?   Use your other arm to feel the nipple area  of your breast. Feel the area with the pads of your three middle fingers by making small circles with your fingers. For the first circle, press lightly. For the second circle, press harder. For the third circle, press even harder. ? Keep making circles with your fingers at the light, harder, and even harder pressures as you move down your breast. Stop when you feel your ribs. ? Move your fingers a little toward the center of your body. ? Start making circles with your fingers again, this time going up until you reach your collarbone. ? Keep making up and down circles until you reach your armpit. Remember to keep using the three pressures. ? Feel the other breast in the same way. 3. Sit or stand in the shower or tub. 4. With soapy water on your skin, feel each breast the same way you did in step 2, when you were lying on the floor. Write Down What You Find After doing the self-exam, write down:  What is normal for each breast.  Any changes you find in each breast.  When you last had your period.  How often should I check my breasts? Check your breasts every month. If you are breastfeeding, the best time to check them is after you feed your baby or after you use a breast pump. If you get periods, the best time to check your breasts is 5-7 days after your period is over. When should I see my doctor? See your doctor if you notice:  A change in shape or size of your breasts or nipples.  A change in the skin of your breast or nipples, such as red or scaly skin.  Unusual fluid coming from your nipples.  A lump or thick area that was not there before.  Pain in your breasts.  Anything that concerns you. This information is not intended to replace advice given to you by your health care provider. Make sure you discuss any questions you have with your health care provider. Document Released: 12/23/2007 Document Revised: 12/12/2015 Document Reviewed: 05/26/2015 Elsevier Interactive Patient  Education  2019 Elsevier Inc.  

## 2019-01-12 NOTE — Telephone Encounter (Signed)
Pt called no answer LM via voicemail to call the office for prescreening. 

## 2019-01-13 ENCOUNTER — Encounter: Payer: Self-pay | Admitting: Obstetrics and Gynecology

## 2019-01-13 ENCOUNTER — Ambulatory Visit (INDEPENDENT_AMBULATORY_CARE_PROVIDER_SITE_OTHER): Payer: BC Managed Care – PPO | Admitting: Obstetrics and Gynecology

## 2019-01-13 ENCOUNTER — Other Ambulatory Visit: Payer: Self-pay

## 2019-01-13 VITALS — BP 102/60 | HR 80 | Ht 64.0 in | Wt 140.7 lb

## 2019-01-13 DIAGNOSIS — E039 Hypothyroidism, unspecified: Secondary | ICD-10-CM | POA: Diagnosis not present

## 2019-01-13 DIAGNOSIS — Z01419 Encounter for gynecological examination (general) (routine) without abnormal findings: Secondary | ICD-10-CM

## 2019-01-13 DIAGNOSIS — Z8742 Personal history of other diseases of the female genital tract: Secondary | ICD-10-CM

## 2019-01-13 DIAGNOSIS — Z13220 Encounter for screening for lipoid disorders: Secondary | ICD-10-CM

## 2019-01-13 DIAGNOSIS — Z1239 Encounter for other screening for malignant neoplasm of breast: Secondary | ICD-10-CM

## 2019-01-13 NOTE — Progress Notes (Signed)
GYNECOLOGY ANNUAL EXAM CLINIC PROGRESS NOTE Subjective:     Tracy Gillespie is a 48 y.o. [id number] female with PMH of hypothyroidism here for annual exam. The patient has no complaints today. The patient is sexually active. The patient wears seatbelts: yes. The patient participates in regular exercise: yes. Has the patient ever been transfused or tattooed?: no. The patient reports that there is not domestic violence in her life.   The patient notes that she has been seeing a fertility specialist (Dr. Weston SettleWise).  Underwent several rounds of ovulation induction with high dose Femara.  Had a miscarriage last month. Notes that he has suggested the option of egg donor use. Patient notes she is still deciding if she desires to move forward with this.    Gynecologic History Patient's last menstrual period was 10/21/2018. Contraception: none Last Pap: 01/07/2018. Results were: normal Last mammogram: 02/02/2018. Results were: normal [facility]  Obstetric History OB History  Gravida Para Term Preterm AB Living  5 1 1   4 1   SAB TAB Ectopic Multiple Live Births  1       1    # Outcome Date GA Lbr Len/2nd Weight Sex Delivery Anes PTL Lv  5 SAB 11/2018 [id number]         4 AB 09/2016          3 Term     M Vag-Spont   LIV  2 AB           1 AB             Obstetric Comments  G3 - patient had 4 years ago, no problems during labor, retained placenta postpartum (had D&C 10 weeks postpartum).  Did have hypothyroidism.   G4 - 09/2016 has miscarriage of twins (6 weeks, but not diagnosed until 11 weeks).       Past Medical History:  Diagnosis Date  . Miscarriage   . Seasonal allergies   . Thyroid disease     Family History  Problem Relation Age of Onset  . Breast cancer Neg Hx   . Ovarian cancer Neg Hx   . Stroke Neg Hx   . Osteoarthritis Neg Hx     Past Surgical History:  Procedure Laterality Date  . DILATION AND EVACUATION N/A 12/09/2016   Procedure:  DILATATION AND EVACUATION;  Surgeon: Christeen DouglasBeasley, Bethany, MD;  Location: [facility] ORS;  Service: Gynecology;  Laterality: N/A;  . TONSILLECTOMY      Social History   Socioeconomic History  . Marital status: Significant Other    Spouse name: Not on file  . Number of children: Not on file  . Years of education: Not on file  . Highest education level: Not on file  Occupational History  . Not on file  Social Needs  . Financial resource strain: Not on file  . Food insecurity    Worry: Not on file    Inability: Not on file  . Transportation needs    Medical: Not on file    Non-medical: Not on file  Tobacco Use  . Smoking status: Never Smoker  . Smokeless tobacco: Never Used  Substance and Sexual Activity  . Alcohol use: No  . Drug use: No  . Sexual activity: Yes    Birth control/protection: None  Lifestyle  . Physical activity    Days per week: Not on file    Minutes per session: Not on file  . Stress: Not on file  Relationships  .  Social Musicianconnections    Talks on phone: Not on file    Gets together: Not on file    Attends religious service: Not on file    Active member of club or organization: Not on file    Attends meetings of clubs or organizations: Not on file    Relationship status: Not on file  . Intimate partner violence    Fear of current or ex partner: Not on file    Emotionally abused: Not on file    Physically abused: Not on file    Forced sexual activity: Not on file  Other Topics Concern  . Not on file  Social History Narrative  . Not on file    Current Outpatient Medications on File Prior to Visit  Medication Sig Dispense Refill  . azelastine (ASTELIN) 0.1 % nasal spray Place into both nostrils 2 (two) times daily. Use in each nostril as directed    . fluticasone (FLONASE) 50 MCG/ACT nasal spray Place into both nostrils daily.    Marland Kitchen. levothyroxine (SYNTHROID) 100 MCG tablet Take 100 mcg by mouth daily before breakfast.    . metFORMIN (GLUCOPHAGE) 500 MG tablet  Take by mouth 2 (two) times daily with a meal.    . Multiple Vitamin (MULTIVITAMIN) tablet Take 1 tablet by mouth daily.     No current facility-administered medications on file prior to visit.     No Known Allergies   Review of Systems Constitutional: negative for chills, fatigue, fevers and sweats Eyes: negative for irritation, redness and visual disturbance Ears, nose, mouth, throat, and face: negative for hearing loss, nasal congestion, snoring and tinnitus Respiratory: negative for asthma, cough, sputum Cardiovascular: negative for chest pain, dyspnea, exertional chest pressure/discomfort, irregular heart beat, palpitations and syncope Gastrointestinal: negative for abdominal pain, change in bowel habits, nausea and vomiting Genitourinary: negative for abnormal menstrual periods, genital lesions, sexual problems and vaginal discharge, dysuria and urinary incontinence Integument/breast: negative for breast lump, breast tenderness and nipple discharge Hematologic/lymphatic: negative for bleeding and easy bruising Musculoskeletal:negative for back pain and muscle weakness Neurological: negative for dizziness, headaches, vertigo and weakness Endocrine: negative for diabetic symptoms including polydipsia, polyuria and skin dryness Allergic/Immunologic: negative for hay fever and urticaria     Objective:    BP 102/60   Pulse 80   Ht 5\' 4"  (1.626 m)   Wt 140 lb 11.2 oz (63.8 kg)   LMP 10/21/2018   BMI 24.15 kg/m   General Appearance:    Alert, cooperative, no distress, appears stated age  Head:    Normocephalic, without obvious abnormality, atraumatic  Eyes:    PERRL, conjunctiva/corneas clear, EOM's intact, both eyes  Ears:    External ear canals, both ears  Nose:   Nares normal, septum midline, mucosa normal, no drainage or sinus tenderness  Throat:   Lips, mucosa, and tongue normal; teeth and gums normal  Neck:   Supple, symmetrical, trachea midline, no adenopathy; thyroid:   no enlargement/tenderness/nodules; no carotid bruit or JVD  Back:     Symmetric, no curvature, ROM normal, no CVA tenderness  Lungs:     Clear to auscultation bilaterally, respirations unlabored  Chest Wall:    No tenderness or deformity   Heart:    Regular rate and rhythm, S1 and S2 normal, no murmur, rub or gallop  Breast Exam:    No tenderness, masses. Bilateral nipple inversion present.   Abdomen:     Soft, non-tender, bowel sounds active all four quadrants, no masses, no organomegaly  Genitalia:    Normal female without lesion, discharge or tenderness  Rectal:    Normal tone, normal prostate, no masses or tenderness  Extremities:   Extremities normal, atraumatic, no cyanosis or edema,   Pulses:   2+ and symmetric all extremities  Skin:   Skin color, texture, turgor normal, no rashes or lesions  Lymph nodes:   Cervical, supraclavicular, and axillary nodes normal  Neurologic:   CNII-XII intact, normal strength, sensation and reflexes throughout     Labs:  Lab Results  Component Value Date   WBC 5.2 03/04/2018   HGB 12.9 03/04/2018   HCT 38.1 03/04/2018   MCV 89 03/04/2018   PLT 305 03/04/2018    Lab Results  Component Value Date   CREATININE 0.71 03/04/2018   BUN 9 03/04/2018   NA 139 03/04/2018   K 4.4 03/04/2018   CL 101 03/04/2018   CO2 24 03/04/2018    Lab Results  Component Value Date   ALT 13 03/04/2018   AST 17 03/04/2018   ALKPHOS 49 03/04/2018   BILITOT 0.4 03/04/2018    Lab Results  Component Value Date   TSH 4.627 (H) 03/07/2017       Component Value Date/Time   CHOL 252 (H) 03/04/2018 0850   TRIG 57 03/04/2018 0850   HDL 76 03/04/2018 0850   CHOLHDL 3.3 03/04/2018 0850   LDLCALC 165 (H) 03/04/2018 0850     No results found for: HGBA1C   Assessment:   Routine gynecologic exam Advanced maternal age  History of miscarriage Hypothyroidism H/o infertility  Plan:   Labs: None ordered.  Has appt with PCP in August.  Contraception:  none. Desiring to conceive Mammogram ordered. Discussed healthy lifestyle interventions. Pap smear up to date.  Patient will continue to f/u with fertility specialist.  Hypothyroidism managed by Endocrinologist  Follow up in: Follow up in 1 year for annual exam.    Rubie Maid, MD [facility]

## 2019-02-08 ENCOUNTER — Ambulatory Visit
Admission: RE | Admit: 2019-02-08 | Discharge: 2019-02-08 | Disposition: A | Discharge status: Home / Self Care | Payer: BC Managed Care – PPO | Source: Ambulatory Visit | Attending: Obstetrics and Gynecology | Admitting: Obstetrics and Gynecology

## 2019-02-08 ENCOUNTER — Other Ambulatory Visit: Payer: Self-pay

## 2019-02-08 DIAGNOSIS — Z1239 Encounter for other screening for malignant neoplasm of breast: Secondary | ICD-10-CM | POA: Diagnosis present

## 2019-02-08 DIAGNOSIS — Z1231 Encounter for screening mammogram for malignant neoplasm of breast: Secondary | ICD-10-CM | POA: Insufficient documentation

## 2019-02-08 DIAGNOSIS — Z01419 Encounter for gynecological examination (general) (routine) without abnormal findings: Secondary | ICD-10-CM

## 2019-09-18 ENCOUNTER — Other Ambulatory Visit: Payer: Self-pay

## 2019-09-18 MED ORDER — METFORMIN HCL 500 MG PO TABS: 500.0000 mg | ORAL_TABLET | Freq: Two times a day (BID) | ORAL | 4 refills | Status: DC

## 2019-10-02 ENCOUNTER — Telehealth: Payer: Self-pay | Admitting: Obstetrics and Gynecology

## 2019-10-02 NOTE — Progress Notes (Signed)
Pt present due to noticing some spotting after her cycle along with green discharge. Pt stated also stated having uti type symptoms UA completed and documented.

## 2019-10-02 NOTE — Telephone Encounter (Signed)
Pt called no answer LM via VM to call the office to speak more about her concerns.  

## 2019-10-02 NOTE — Telephone Encounter (Signed)
Pt called and stated that she experience spotting After her period, as well as has some discharge. The pt wanted to be seen today, I told her cherry was in surgery on mondays. The pt wanted to know if it was okay to wait till tomorrow. Pt is requesting a call back. Please advise

## 2019-10-02 NOTE — Telephone Encounter (Signed)
Pt called back and stated that she had some spotting and some green discharge. Pt stated that she was not sure if it was an yeast infection or bacterial infection. Pt made an appointment to be seen tomorrow for treatment.

## 2019-10-03 ENCOUNTER — Ambulatory Visit: Payer: BC Managed Care – PPO | Admitting: Obstetrics and Gynecology

## 2019-10-03 ENCOUNTER — Encounter: Payer: Self-pay | Admitting: Obstetrics and Gynecology

## 2019-10-03 ENCOUNTER — Telehealth: Payer: Self-pay | Admitting: Obstetrics and Gynecology

## 2019-10-03 ENCOUNTER — Other Ambulatory Visit: Payer: Self-pay

## 2019-10-03 VITALS — BP 99/64 | HR 76 | Ht 64.0 in | Wt 141.3 lb

## 2019-10-03 DIAGNOSIS — N898 Other specified noninflammatory disorders of vagina: Secondary | ICD-10-CM | POA: Diagnosis not present

## 2019-10-03 DIAGNOSIS — R102 Pelvic and perineal pain: Secondary | ICD-10-CM

## 2019-10-03 DIAGNOSIS — N939 Abnormal uterine and vaginal bleeding, unspecified: Secondary | ICD-10-CM

## 2019-10-03 LAB — POCT URINALYSIS DIPSTICK OB
Bilirubin, UA: NEGATIVE
Blood, UA: NEGATIVE
Glucose, UA: NEGATIVE
Ketones, UA: NEGATIVE
Leukocytes, UA: NEGATIVE
Nitrite, UA: NEGATIVE
POC,PROTEIN,UA: NEGATIVE
Spec Grav, UA: <=1.005 — AB (ref 1.010–1.025)
Urobilinogen, UA: 0.2 E.U./dL
pH, UA: 6 (ref 5.0–8.0)

## 2019-10-03 MED ORDER — SOLOSEC 2 G PO PACK: 1.0000 | PACK | Freq: Once | ORAL | 0 refills | Status: AC

## 2019-10-03 MED ORDER — FLUCONAZOLE 150 MG PO TABS: 150.0000 mg | ORAL_TABLET | Freq: Once | ORAL | 3 refills | Status: AC

## 2019-10-03 NOTE — Telephone Encounter (Signed)
Patient called in to follow-up on the prior authorization for a prescription that was called in for her today.

## 2019-10-03 NOTE — Telephone Encounter (Signed)
Pt called in and stated that she was just seen today. The solosec needs a prior authorization. The pt is was calling because she needs it asap. The pt said that she will take something else that doesn't need a prior authorization. The pt is requesting a call back. Please advise

## 2019-10-03 NOTE — Patient Instructions (Addendum)
Bacterial Vaginosis  Bacterial vaginosis is a vaginal infection that occurs when the normal balance of bacteria in the vagina is disrupted. It results from an overgrowth of certain bacteria. This is the most common vaginal infection among women ages 15-44. Because bacterial vaginosis increases your risk for STIs (sexually transmitted infections), getting treated can help reduce your risk for chlamydia, gonorrhea, herpes, and HIV (human immunodeficiency virus). Treatment is also important for preventing complications in pregnant women, because this condition can cause an early (premature) delivery. What are the causes? This condition is caused by an increase in harmful bacteria that are normally present in small amounts in the vagina. However, the reason that the condition develops is not fully understood. What increases the risk? The following factors may make you more likely to develop this condition:  Having a new sexual partner or multiple sexual partners.  Having unprotected sex.  Douching.  Having an intrauterine device (IUD).  Smoking.  Drug and alcohol abuse.  Taking certain antibiotic medicines.  Being pregnant. You cannot get bacterial vaginosis from toilet seats, bedding, swimming pools, or contact with objects around you. What are the signs or symptoms? Symptoms of this condition include:  Grey or white vaginal discharge. The discharge can also be watery or foamy.  A fish-like odor with discharge, especially after sexual intercourse or during menstruation.  Itching in and around the vagina.  Burning or pain with urination. Some women with bacterial vaginosis have no signs or symptoms. How is this diagnosed? This condition is diagnosed based on:  Your medical history.  A physical exam of the vagina.  Testing a sample of vaginal fluid under a microscope to look for a large amount of bad bacteria or abnormal cells. Your health care provider may use a cotton swab or  a small wooden spatula to collect the sample. How is this treated? This condition is treated with antibiotics. These may be given as a pill, a vaginal cream, or a medicine that is put into the vagina (suppository). If the condition comes back after treatment, a second round of antibiotics may be needed. Follow these instructions at home: Medicines  Take over-the-counter and prescription medicines only as told by your health care provider.  Take or use your antibiotic as told by your health care provider. Do not stop taking or using the antibiotic even if you start to feel better. General instructions  If you have a female sexual partner, tell her that you have a vaginal infection. She should see her health care provider and be treated if she has symptoms. If you have a female sexual partner, he does not need treatment.  During treatment: ? Avoid sexual activity until you finish treatment. ? Do not douche. ? Avoid alcohol as directed by your health care provider. ? Avoid breastfeeding as directed by your health care provider.  Drink enough water and fluids to keep your urine clear or pale yellow.  Keep the area around your vagina and rectum clean. ? Wash the area daily with warm water. ? Wipe yourself from front to back after using the toilet.  Keep all follow-up visits as told by your health care provider. This is important. How is this prevented?  Do not douche.  Wash the outside of your vagina with warm water only.  Use protection when having sex. This includes latex condoms and dental dams.  Limit how many sexual partners you have. To help prevent bacterial vaginosis, it is best to have sex with just one partner (  monogamous).  Make sure you and your sexual partner are tested for STIs.  Wear cotton or cotton-lined underwear.  Avoid wearing tight pants and pantyhose, especially during summer.  Limit the amount of alcohol that you drink.  Do not use any products that contain  nicotine or tobacco, such as cigarettes and e-cigarettes. If you need help quitting, ask your health care provider.  Do not use illegal drugs. Where to find more information  Centers for Disease Control and Prevention: www.cdc.gov/std  American Sexual Health Association (ASHA): www.ashastd.org  U.S. Department of Health and Human Services, Office on Women's Health: www.womenshealth.gov/ or https://www.womenshealth.gov/a-z-topics/bacterial-vaginosis Contact a health care provider if:  Your symptoms do not improve, even after treatment.  You have more discharge or pain when urinating.  You have a fever.  You have pain in your abdomen.  You have pain during sex.  You have vaginal bleeding between periods. Summary  Bacterial vaginosis is a vaginal infection that occurs when the normal balance of bacteria in the vagina is disrupted.  Because bacterial vaginosis increases your risk for STIs (sexually transmitted infections), getting treated can help reduce your risk for chlamydia, gonorrhea, herpes, and HIV (human immunodeficiency virus). Treatment is also important for preventing complications in pregnant women, because the condition can cause an early (premature) delivery.  This condition is treated with antibiotic medicines. These may be given as a pill, a vaginal cream, or a medicine that is put into the vagina (suppository). This information is not intended to replace advice given to you by your health care provider. Make sure you discuss any questions you have with your health care provider. Document Revised: 06/18/2017 Document Reviewed: 03/21/2016 Elsevier Patient Education  2020 [employer]vier Inc[employer]  

## 2019-10-03 NOTE — Progress Notes (Signed)
    GYNECOLOGY PROGRESS NOTE  Subjective:    Patient ID: Tracy Gillespie, female    DOB: November 03, 1970, 49 y.o.   MRN: [id number]  HPI  Patient is a 49 y.o. G61P1041 married female who presents for spotting after cycle and green discharge x 1 week. Started 1-2 days after her menstrual cycle. Patient's last menstrual period was 09/17/2019.   Reports pelvic cramping when spotting occurred. Denies urinary symptoms (no dysuria, hematuria, back pain).   The following portions of the patient's history were reviewed and updated as appropriate: allergies, current medications, past family history, past medical history, past social history, past surgical history and problem list.  Review of Systems Pertinent items noted in HPI and remainder of comprehensive ROS otherwise negative.   Objective:   Blood pressure 99/64, pulse 76, height 5\' 4"  (1.626 m), weight 141 lb 4.8 oz (64.1 kg), last menstrual period 09/17/2019. General appearance: alert and no distress Abdomen: soft, non-tender; bowel sounds normal; no masses,  no organomegaly Pelvic: external genitalia normal, rectovaginal septum normal.  Vagina with scant thin grey discharge.  Cervix normal appearing, no lesions and no motion tenderness. Bimanual exam not performed.     Labs:  Microscopic wet-mount exam shows moderate clue cells, no hyphae, no trichomonads, few white blood cells. KOH done.    Results for orders placed or performed in visit on 10/03/19  POC Urinalysis Dipstick OB  Result Value Ref Range   Color, UA light yellow    Clarity, UA clear    Glucose, UA Negative Negative   Bilirubin, UA neg    Ketones, UA neg    Spec Grav, UA <=1.005 (A) 1.010 - 1.025   Blood, UA neg    pH, UA 6.0 5.0 - 8.0   POC,PROTEIN,UA Negative Negative, Trace, Small (1+), Moderate (2+), Large (3+), 4+   Urobilinogen, UA 0.2 0.2 or 1.0 E.U./dL   Nitrite, UA neg    Leukocytes, UA Negative Negative   Appearance light yellow/clear    Odor        Assessment:   Bacterial vaginosis Vaginal spotting Pelvic cramping  Plan:   - Bacterial vaginosis, counseled on treatment options. Patient willing to try Solosec.  Also will send in prescription for Diflucan in case of yeast infection after antibiotic use.  - Vaginal spotting likely secondary to vaginitis. Will treat, and if no resolution, patient can return for further workup.  - Pelvic cramping likely secondary to vaginitis.  UA today negative. Patient without urinary symptoms.    10/05/19, MD [facility]

## 2019-10-04 ENCOUNTER — Other Ambulatory Visit: Payer: Self-pay

## 2019-10-04 ENCOUNTER — Telehealth: Payer: Self-pay | Admitting: Obstetrics and Gynecology

## 2019-10-04 MED ORDER — METRONIDAZOLE 500 MG PO TABS: 500.0000 mg | ORAL_TABLET | Freq: Two times a day (BID) | ORAL | 0 refills | Status: DC

## 2019-10-04 NOTE — Telephone Encounter (Signed)
Called [facility] prior authorization for medication Solosec. Was informed that the medication would not be covered and pt needed to try another form of medication.

## 2019-10-04 NOTE — Telephone Encounter (Signed)
Patient called in stating that she is still without medication and that her symptoms are starting to worsen. She states that she is having abdominal pain, chills, heavy bleeding w/ discharge.

## 2019-10-05 NOTE — Telephone Encounter (Signed)
Completed yesterday

## 2019-10-09 ENCOUNTER — Telehealth: Payer: Self-pay

## 2019-10-09 NOTE — Telephone Encounter (Signed)
Spoke with pt concerning her vaginal bleeding and discomfort. Pt stated that she started her medication on Wednesday and noticed that she started bleeding. Pt stated that she went to Urgent Care for her symptoms over the weekend. Pt stated she was informed from urgent care that the bleeding was a normal cycle. Pt was advised that if her cycle continue after a week to please contact the office. Pt voiced that she understood.

## 2019-11-14 ENCOUNTER — Telehealth: Payer: Self-pay | Admitting: Obstetrics and Gynecology

## 2019-11-14 NOTE — Telephone Encounter (Signed)
Pt is requesting a call from the nurse. The pt is having an irregular period . The pt stated that she has had both covid shots. Please advise

## 2019-11-15 NOTE — Telephone Encounter (Signed)
Spoke with pt concerning her call to the office. Pt stated that she has noticed a lot of changes in her cycle since getting the COVID vaccine. Pt stated that her cycles are off and on. Pt stated that some days her cycle is heavy and then some days it is really light or old brown blood. Pt is concerned if her cycles will return back to normal or if there is something else going on like her cycles being off due to the bacterial infection.

## 2019-12-12 ENCOUNTER — Encounter: Payer: Self-pay | Admitting: Obstetrics and Gynecology

## 2019-12-12 ENCOUNTER — Ambulatory Visit: Payer: BC Managed Care – PPO | Admitting: Obstetrics and Gynecology

## 2019-12-12 ENCOUNTER — Other Ambulatory Visit: Payer: Self-pay

## 2019-12-12 VITALS — BP 103/66 | HR 66 | Ht 64.0 in | Wt 136.9 lb

## 2019-12-12 DIAGNOSIS — N951 Menopausal and female climacteric states: Secondary | ICD-10-CM | POA: Diagnosis not present

## 2019-12-12 DIAGNOSIS — N938 Other specified abnormal uterine and vaginal bleeding: Secondary | ICD-10-CM | POA: Diagnosis not present

## 2019-12-12 NOTE — Progress Notes (Signed)
HPI:      [name] is a 49 y.o. [phone] who LMP was Patient's last menstrual period was 12/04/2019.  Subjective:   She presents today stating that over the last few months her cycles have become different/irregular.  She was having normal regular cycles up until about 4 months ago.  And her period was light and somewhat abnormal followed by a very heavy menses with large clots and " flooding". This irregular bleeding also coincided with her 2 Covid shots and she had some concerns whether her clotting was affected by the shots. She is sexually active and not using birth control at this time. She denies hot flashes or other signs and symptoms of menopause.    Hx: The following portions of the patient's history were reviewed and updated as appropriate:             She  has a past medical history of Miscarriage, Seasonal allergies, and Thyroid disease. She does not have a problem list on file. She  has a past surgical history that includes Tonsillectomy and Dilation and evacuation (N/A, 12/09/2016). Her family history is not on file. She  reports that she has never smoked. She has never used smokeless tobacco. She reports that she does not drink alcohol or use drugs. She has a current medication list which includes the following prescription(s): azelastine, fluticasone, levothyroxine, metformin, and multivitamin. She has No Known Allergies.       Review of Systems:  Review of Systems  Constitutional: Denied constitutional symptoms, night sweats, recent illness, fatigue, fever, insomnia and weight loss.  Eyes: Denied eye symptoms, eye pain, photophobia, vision change and visual disturbance.  Ears/Nose/Throat/Neck: Denied ear, nose, throat or neck symptoms, hearing loss, nasal discharge, sinus congestion and sore throat.  Cardiovascular: Denied cardiovascular symptoms, arrhythmia, chest pain/pressure, edema, exercise intolerance, orthopnea and palpitations.  Respiratory: Denied  pulmonary symptoms, asthma, pleuritic pain, productive sputum, cough, dyspnea and wheezing.  Gastrointestinal: Denied, gastro-esophageal reflux, melena, nausea and vomiting.  Genitourinary: See HPI for additional information.  Musculoskeletal: Denied musculoskeletal symptoms, stiffness, swelling, muscle weakness and myalgia.  Dermatologic: Denied dermatology symptoms, rash and scar.  Neurologic: Denied neurology symptoms, dizziness, headache, neck pain and syncope.  Psychiatric: Denied psychiatric symptoms, anxiety and depression.  Endocrine: Denied endocrine symptoms including hot flashes and night sweats.   Meds:   Current Outpatient Medications on File Prior to Visit  Medication Sig Dispense Refill  . azelastine (ASTELIN) 0.1 % nasal spray Place into both nostrils 2 (two) times daily. Use in each nostril as directed    . fluticasone (FLONASE) 50 MCG/ACT nasal spray Place into both nostrils daily.    Marland Kitchen levothyroxine (SYNTHROID) 88 MCG tablet Take 88 mcg by mouth daily before breakfast.    . metFORMIN (GLUCOPHAGE) 500 MG tablet Take 1 tablet (500 mg total) by mouth 2 (two) times daily with a meal. 60 tablet 4  . Multiple Vitamin (MULTIVITAMIN) tablet Take 1 tablet by mouth daily.     No current facility-administered medications on file prior to visit.    Objective:     Vitals:   12/12/19 0934  BP: 103/66  Pulse: 66                Assessment:    G5P1041 There are no problems to display for this patient.    1. Climacteric   2. Dysfunctional uterine bleeding     Patient likely climacteric.  Having some ovulatory cycles and some anovulatory resulting in some cycles with  light bleeding is some with heavy.  I believe the Covid shot is not involved and simply coincident.   Plan:            1.  We have discussed irregular heavy bleeding in some detail.  I have reassured her that if she had normal regular monthly cycles up until a few months ago her risk for endometrial  hyperplasia or cancer is very low. We have discussed regulated cycling using hormonal methods including OCPs, progesterone only and IUD.  She has declined this at this time.  She has elected to employ expectant management and see what happens with her cycle over the next few months.  If it remains irregular and heavy consider possible further work-up with ultrasound and management using hormonal manipulation.  All questions answered. Orders No orders of the defined types were placed in this encounter.   No orders of the defined types were placed in this encounter.     F/U  No follow-ups on file. I spent 24 minutes involved in the care of this patient preparing to see the patient by obtaining and reviewing her medical history (including labs, imaging tests and prior procedures), documenting clinical information in the electronic health record (EHR), counseling and coordinating care plans, writing and sending prescriptions, ordering tests or procedures and directly communicating with the patient by discussing pertinent items from her history and physical exam as well as detailing my assessment and plan as noted above so that she has an informed understanding.  All of her questions were answered.  Finis Bud, M.D. 12/12/2019 10:18 AM

## 2019-12-22 ENCOUNTER — Telehealth: Payer: Self-pay | Admitting: Obstetrics and Gynecology

## 2019-12-22 NOTE — Telephone Encounter (Signed)
Patient called in wanting to set up an appointment to be seen, patient stated that ever since she got the COVID vaccine that her periods have been all over the place and that she has had her period for a month now. Patient states that the bleeding has stopped and the blood is looking like what it would appear the end of a period is like, however this has been going on for a week. Scheduled patient for June 16th however patient would like to know if she could be squeezed in anywhere sooner as she is worried this isn't good to be bleeding for a month straight. Could you please advise?

## 2020-01-02 NOTE — Telephone Encounter (Signed)
Hello [place], I see Dr.Cherry has a few opens this week could you please reach out to the pt and see if she can come in on Friday. Thanks [employer]

## 2020-01-03 ENCOUNTER — Encounter: Payer: BC Managed Care – PPO | Admitting: Obstetrics and Gynecology

## 2020-01-03 NOTE — Telephone Encounter (Signed)
Called pt and stated that she went to see Dr. Alberteen Spindle. She stated that she was told its her age and that she is going through pre postmenopause. The pt stated that the blood clots have stopped. The pt stated she is fine and will see [place] the 29th.

## 2020-01-05 ENCOUNTER — Telehealth: Payer: Self-pay | Admitting: Obstetrics and Gynecology

## 2020-01-05 NOTE — Telephone Encounter (Signed)
Pt called no answer LM via LM that I was returning her call to the office concerning her vaginal bleeding and blood clots. Pt was advised via VM to take a picture of the clot and sent it via mychart or she could just save the picture and show it to [facility] at her next visit. Pt was advised that if she had any other problems or concerns to please contact the office.

## 2020-01-05 NOTE — Telephone Encounter (Signed)
Pt called in and stated that she started bleeding again, and now its bight red. The pt stated that when she was using the bathroom that something came out the pt described it as like a red clot  that has like veins. The pt is wanting to know should she keep it and bring it to her next apt... I told the pt I would send a message to the nurse. Please advise  Sorry I sent high I just know we are going into the weekend and the pt stated does she need to keep it in her fridge till her next apt.Marland Kitchen

## 2020-01-05 NOTE — Telephone Encounter (Signed)
Pt called back and spoke with pt. Pt stated that the tissue that came from her vaginal area was not a clot it looked like something else but was unsure what it was. Pt was advised to save the tissue until her visit to see [facility] on 01/16/2020. Pt was advised that if she needed to be seen earlier than that to please call and contact the office. Pt voiced that she understood.

## 2020-01-16 ENCOUNTER — Encounter: Payer: Self-pay | Admitting: Obstetrics and Gynecology

## 2020-01-16 ENCOUNTER — Other Ambulatory Visit (HOSPITAL_COMMUNITY)
Admission: RE | Admit: 2020-01-16 | Discharge: 2020-01-16 | Disposition: A | Discharge status: Home / Self Care | Payer: BC Managed Care – PPO | Source: Ambulatory Visit | Attending: Obstetrics and Gynecology | Admitting: Obstetrics and Gynecology

## 2020-01-16 ENCOUNTER — Ambulatory Visit (INDEPENDENT_AMBULATORY_CARE_PROVIDER_SITE_OTHER): Payer: BC Managed Care – PPO | Admitting: Obstetrics and Gynecology

## 2020-01-16 ENCOUNTER — Other Ambulatory Visit: Payer: Self-pay

## 2020-01-16 VITALS — BP 100/68 | HR 58 | Ht 65.0 in | Wt 138.0 lb

## 2020-01-16 DIAGNOSIS — Z01419 Encounter for gynecological examination (general) (routine) without abnormal findings: Secondary | ICD-10-CM

## 2020-01-16 DIAGNOSIS — Z8742 Personal history of other diseases of the female genital tract: Secondary | ICD-10-CM

## 2020-01-16 DIAGNOSIS — Z1231 Encounter for screening mammogram for malignant neoplasm of breast: Secondary | ICD-10-CM | POA: Diagnosis not present

## 2020-01-16 DIAGNOSIS — N939 Abnormal uterine and vaginal bleeding, unspecified: Secondary | ICD-10-CM | POA: Diagnosis not present

## 2020-01-16 MED ORDER — METFORMIN HCL 500 MG PO TABS: 500.0000 mg | ORAL_TABLET | Freq: Two times a day (BID) | ORAL | 11 refills | Status: DC

## 2020-01-16 NOTE — Progress Notes (Signed)
GYNECOLOGY ANNUAL EXAM CLINIC PROGRESS NOTE Subjective:     Tracy Gillespie is a 49 y.o. G25P1021 female with PMH of hypothyroidism here for annual exam. The patient is sexually active. The patient wears seatbelts: yes. The patient participates in regular exercise: yes. Has the patient ever been transfused or tattooed?: no.   The patient desires to note the following today:  1. States that she has finally stopped bleeding.  Was having a heavy prolonged cycle for most of the month last month.  Also reports passage of some type of tissue.  Brought the specimen in today for possible evaluation.  2. The patient notes that she has been seeing a fertility specialist ([facility]).  Has received her first injection in March, had second injection in April. Also had an ultrasound ~ 1 month ago that was normal.     Gynecologic History Patient's last menstrual period was 12/04/2019.  Contraception: none.  Last Pap: 01/07/2018. Results were: normal Last mammogram: 02/08/2019. Results were: normal   Obstetric History OB History  Gravida Para Term Preterm AB Living  5 1 1   4 1   SAB TAB Ectopic Multiple Live Births  1       1    # Outcome Date GA Lbr Len/2nd Weight Sex Delivery Anes PTL Lv  5 SAB 11/2018 [id number]         4 AB 09/2016          3 Term     M Vag-Spont   LIV  2 AB           1 AB             Obstetric Comments  G3 - patient had 4 years ago, no problems during labor, retained placenta postpartum (had D&C 10 weeks postpartum).  Did have hypothyroidism.   G4 - 09/2016 has miscarriage of twins (6 weeks, but not diagnosed until 11 weeks).       Past Medical History:  Diagnosis Date   Miscarriage    Seasonal allergies    Thyroid disease     Family History  Problem Relation Age of Onset   Breast cancer Neg Hx    Ovarian cancer Neg Hx    Stroke Neg Hx    Osteoarthritis Neg Hx     Past Surgical History:  Procedure Laterality Date   DILATION AND  EVACUATION N/A 12/09/2016   Procedure: DILATATION AND EVACUATION;  Surgeon: 12/11/2016, MD;  Location: [facility] ORS;  Service: Gynecology;  Laterality: N/A;   TONSILLECTOMY      Social History   Socioeconomic History   Marital status: Significant Other    Spouse name: Not on file   Number of children: Not on file   Years of education: Not on file   Highest education level: Not on file  Occupational History   Not on file  Tobacco Use   Smoking status: Never Smoker   Smokeless tobacco: Never Used  Vaping Use   Vaping Use: Never used  Substance and Sexual Activity   Alcohol use: No   Drug use: No   Sexual activity: Yes    Birth control/protection: None  Other Topics Concern   Not on file  Social History Narrative   Not on file   Social Determinants of Health   Financial Resource Strain:    Difficulty of Paying Living Expenses:   Food Insecurity:    Worried About Christeen Douglas of Food in the Last Year:  Ran Out of Food in the Last Year:   Transportation Needs:    Freight forwarder (Medical):    Lack of Transportation (Non-Medical):   Physical Activity:    Days of Exercise per Week:    Minutes of Exercise per Session:   Stress:    Feeling of Stress :   Social Connections:    Frequency of Communication with Friends and Family:    Frequency of Social Gatherings with Friends and Family:    Attends Religious Services:    Active Member of Clubs or Organizations:    Attends Engineer, structural:    Marital Status:   Intimate Partner Violence:    Fear of Current or Ex-Partner:    Emotionally Abused:    Physically Abused:    Sexually Abused:     Current Outpatient Medications on File Prior to Visit  Medication Sig Dispense Refill   azelastine (ASTELIN) 0.1 % nasal spray Place into both nostrils 2 (two) times daily. Use in each nostril as directed     fluticasone (FLONASE) 50 MCG/ACT nasal spray Place into both nostrils  daily.     levothyroxine (SYNTHROID) 88 MCG tablet Take 88 mcg by mouth daily before breakfast. Take one tablet once daily before breakfast. Once a week take 1/2 tablet. (Thursdays)     metFORMIN (GLUCOPHAGE) 500 MG tablet Take 1 tablet (500 mg total) by mouth 2 (two) times daily with a meal. 60 tablet 4   Multiple Vitamin (MULTIVITAMIN) tablet Take 1 tablet by mouth daily.     No current facility-administered medications on file prior to visit.    No Known Allergies   Review of Systems Constitutional: negative for chills, fatigue, fevers and sweats Eyes: negative for irritation, redness and visual disturbance Ears, nose, mouth, throat, and face: negative for hearing loss, nasal congestion, snoring and tinnitus Respiratory: negative for asthma, cough, sputum Cardiovascular: negative for chest pain, dyspnea, exertional chest pressure/discomfort, irregular heart beat, palpitations and syncope Gastrointestinal: negative for abdominal pain, change in bowel habits, nausea and vomiting Genitourinary: positive for abnormal menstrual periods (see HPI).  Negative for genital lesions, sexual problems and vaginal discharge, dysuria and urinary incontinence.  Integument/breast: negative for breast lump, breast tenderness and nipple discharge Hematologic/lymphatic: negative for bleeding and easy bruising Musculoskeletal:negative for back pain and muscle weakness Neurological: negative for dizziness, headaches, vertigo and weakness Endocrine: negative for diabetic symptoms including polydipsia, polyuria and skin dryness Allergic/Immunologic: negative for hay fever and urticaria     Objective:    BP 100/68    Pulse (!) 58    Ht 5\' 5"  (1.651 m)    Wt 138 lb (62.6 kg)    LMP 12/04/2019    BMI 22.96 kg/m   General Appearance:    Alert, cooperative, no distress, appears stated age  Head:    Normocephalic, without obvious abnormality, atraumatic  Eyes:    PERRL, conjunctiva/corneas clear, EOM's  intact, both eyes  Ears:    External ear canals, both ears  Nose:   Nares normal, septum midline, mucosa normal, no drainage or sinus tenderness  Throat:   Lips, mucosa, and tongue normal; teeth and gums normal  Neck:   Supple, symmetrical, trachea midline, no adenopathy; thyroid:  no enlargement/tenderness/nodules; no carotid bruit or JVD  Back:     Symmetric, no curvature, ROM normal, no CVA tenderness  Lungs:     Clear to auscultation bilaterally, respirations unlabored  Chest Wall:    No tenderness or deformity   Heart:  Regular rate and rhythm, S1 and S2 normal, no murmur, rub or gallop  Breast Exam:    No tenderness, masses. Bilateral nipple inversion present.   Abdomen:     Soft, non-tender, bowel sounds active all four quadrants, no masses, no organomegaly  Genitalia:    Normal female without lesion, discharge or tenderness  Rectal:    Normal tone, normal prostate, no masses or tenderness  Extremities:   Extremities normal, atraumatic, no cyanosis or edema,   Pulses:   2+ and symmetric all extremities  Skin:   Skin color, texture, turgor normal, no rashes or lesions  Lymph nodes:   Cervical, supraclavicular, and axillary nodes normal  Neurologic:   CNII-XII intact, normal strength, sensation and reflexes throughout     Labs:  Labs reviewed in Care Everywhere (09/2019), ordered by Endocrinologist   Assessment:   Routine gynecologic exam Hypothyroidism H/o infertility History of irregular menstrual bleeding  Plan:   - Labs: None ordered.  Has appt with PCP in August. Also had some labs performed by - - Endocrinologist in March, reviewed in Care Everywhere.  - Contraception: none. Desiring to conceive. Being seen by fertility specialist.  - Mammogram ordered. - Discussed healthy lifestyle interventions. - Pap smear up to date. Due in 1 year.  - History of irregular bleeding last month. Had normal thyroid hormones. Has resolved. Will send specimen to pathology.  -  Hypothyroidism managed by Endocrinologist.  - Follow up in 1 year for annual exam.    Hildred Laser, MD [facility]

## 2020-01-16 NOTE — Patient Instructions (Signed)
Health Maintenance, Female Adopting a healthy lifestyle and getting preventive care are important in promoting health and wellness. Ask your health care provider about:  The right schedule for you to have regular tests and exams.  Things you can do on your own to prevent diseases and keep yourself healthy. What should I know about diet, weight, and exercise? Eat a healthy diet   Eat a diet that includes plenty of vegetables, fruits, low-fat dairy products, and lean protein.  Do not eat a lot of foods that are high in solid fats, added sugars, or sodium. Maintain a healthy weight Body mass index (BMI) is used to identify weight problems. It estimates body fat based on height and weight. Your health care provider can help determine your BMI and help you achieve or maintain a healthy weight. Get regular exercise Get regular exercise. This is one of the most important things you can do for your health. Most adults should:  Exercise for at least 150 minutes each week. The exercise should increase your heart rate and make you sweat (moderate-intensity exercise).  Do strengthening exercises at least twice a week. This is in addition to the moderate-intensity exercise.  Spend less time sitting. Even light physical activity can be beneficial. Watch cholesterol and blood lipids Have your blood tested for lipids and cholesterol at 50 years of age, then have this test every 5 years. Have your cholesterol levels checked more often if:  Your lipid or cholesterol levels are high.  You are older than 50 years of age.  You are at high risk for heart disease. What should I know about cancer screening? Depending on your health history and family history, you may need to have cancer screening at various ages. This may include screening for:  Breast cancer.  Cervical cancer.  Colorectal cancer.  Skin cancer.  Lung cancer. What should I know about heart disease, diabetes, and high blood  pressure? Blood pressure and heart disease  High blood pressure causes heart disease and increases the risk of stroke. This is more likely to develop in people who have high blood pressure readings, are of African descent, or are overweight.  Have your blood pressure checked: ? Every 3-5 years if you are 18-39 years of age. ? Every year if you are 40 years old or older. Diabetes Have regular diabetes screenings. This checks your fasting blood sugar level. Have the screening done:  Once every three years after age 40 if you are at a normal weight and have a low risk for diabetes.  More often and at a younger age if you are overweight or have a high risk for diabetes. What should I know about preventing infection? Hepatitis B If you have a higher risk for hepatitis B, you should be screened for this virus. Talk with your health care provider to find out if you are at risk for hepatitis B infection. Hepatitis C Testing is recommended for:  Everyone born from 1945 through 1965.  Anyone with known risk factors for hepatitis C. Sexually transmitted infections (STIs)  Get screened for STIs, including gonorrhea and chlamydia, if: ? You are sexually active and are younger than 50 years of age. ? You are older than 50 years of age and your health care provider tells you that you are at risk for this type of infection. ? Your sexual activity has changed since you were last screened, and you are at increased risk for chlamydia or gonorrhea. Ask your health care provider if   you are at risk.  Ask your health care provider about whether you are at high risk for HIV. Your health care provider may recommend a prescription medicine to help prevent HIV infection. If you choose to take medicine to prevent HIV, you should first get tested for HIV. You should then be tested every 3 months for as long as you are taking the medicine. Pregnancy  If you are about to stop having your period (premenopausal) and  you may become pregnant, seek counseling before you get pregnant.  Take 400 to 800 micrograms (mcg) of folic acid every day if you become pregnant.  Ask for birth control (contraception) if you want to prevent pregnancy. Osteoporosis and menopause Osteoporosis is a disease in which the bones lose minerals and strength with aging. This can result in bone fractures. If you are 71 years old or older, or if you are at risk for osteoporosis and fractures, ask your health care provider if you should:  Be screened for bone loss.  Take a calcium or vitamin D supplement to lower your risk of fractures.  Be given hormone replacement therapy (HRT) to treat symptoms of menopause. Follow these instructions at home: Lifestyle  Do not use any products that contain nicotine or tobacco, such as cigarettes, e-cigarettes, and chewing tobacco. If you need help quitting, ask your health care provider.  Do not use street drugs.  Do not share needles.  Ask your health care provider for help if you need support or information about quitting drugs. Alcohol use  Do not drink alcohol if: ? Your health care provider tells you not to drink. ? You are pregnant, may be pregnant, or are planning to become pregnant.  If you drink alcohol: ? Limit how much you use to 0-1 drink a day. ? Limit intake if you are breastfeeding.  Be aware of how much alcohol is in your drink. In the [place] one drink equals one 12 oz bottle of beer (355 mL), one 5 oz glass of wine (148 mL), or one 1 oz glass of hard liquor (44 mL). General instructions  Schedule regular health, dental, and eye exams.  Stay current with your vaccines.  Tell your health care provider if: ? You often feel depressed. ? You have ever been abused or do not feel safe at home. Summary  Adopting a healthy lifestyle and getting preventive care are important in promoting health and wellness.  Follow your health care provider's instructions about healthy  diet, exercising, and getting tested or screened for diseases.  Follow your health care provider's instructions on monitoring your cholesterol and blood pressure. This information is not intended to replace advice given to you by your health care provider. Make sure you discuss any questions you have with your health care provider. Document Revised: 06/29/2018 Document Reviewed: 06/29/2018 Elsevier Patient Education  2020 [employer]    Abnormal Uterine Bleeding Abnormal uterine bleeding means bleeding more than usual from your uterus. It can include:  Bleeding between periods.  Bleeding after sex.  Bleeding that is heavier than normal.  Periods that last longer than usual.  Bleeding after you have stopped having your period (menopause). There are many problems that may cause this. You should see a doctor for any kind of bleeding that is not normal. Treatment depends on the cause of the bleeding. Follow these instructions at home:  Watch your condition for any changes.  Do not use tampons, douche, or have sex, if your doctor tells you not  to.  Change your pads often.  Get regular well-woman exams. Make sure they include a pelvic exam and cervical cancer screening.  Keep all follow-up visits as told by your doctor. This is important. Contact a doctor if:  The bleeding lasts more than one week.  You feel dizzy at times.  You feel like you are going to throw up (nauseous).  You throw up. Get help right away if:  You pass out.  You have to change pads every hour.  You have belly (abdominal) pain.  You have a fever.  You get sweaty.  You get weak.  You passing large blood clots from your vagina. Summary  Abnormal uterine bleeding means bleeding more than usual from your uterus.  There are many problems that may cause this. You should see a doctor for any kind of bleeding that is not normal.  Treatment depends on the cause of the bleeding. This information  is not intended to replace advice given to you by your health care provider. Make sure you discuss any questions you have with your health care provider. Document Revised: 06/30/2016 Document Reviewed: 06/30/2016 Elsevier Patient Education  2020 [employer].

## 2020-01-16 NOTE — Progress Notes (Signed)
Pt present for annual exam. Pt stated having prolonged cycles along with passing a clot of tissue. Pt stated that her cycle started 12/04/2019 and ended on 01/08/2020. Possible rash in the vaginal area due to wearing pads so long.

## 2020-01-18 LAB — SURGICAL PATHOLOGY

## 2020-03-08 ENCOUNTER — Ambulatory Visit
Admission: RE | Admit: 2020-03-08 | Discharge: 2020-03-08 | Disposition: A | Discharge status: Home / Self Care | Payer: BC Managed Care – PPO | Source: Ambulatory Visit | Attending: Obstetrics and Gynecology | Admitting: Obstetrics and Gynecology

## 2020-03-08 ENCOUNTER — Other Ambulatory Visit: Payer: Self-pay

## 2020-03-08 DIAGNOSIS — Z1231 Encounter for screening mammogram for malignant neoplasm of breast: Secondary | ICD-10-CM | POA: Diagnosis present

## 2020-03-08 DIAGNOSIS — R920 Mammographic microcalcification found on diagnostic imaging of breast: Secondary | ICD-10-CM | POA: Insufficient documentation

## 2020-03-08 DIAGNOSIS — Z8742 Personal history of other diseases of the female genital tract: Secondary | ICD-10-CM

## 2020-03-13 ENCOUNTER — Other Ambulatory Visit: Payer: Self-pay | Admitting: Obstetrics and Gynecology

## 2020-03-13 DIAGNOSIS — R921 Mammographic calcification found on diagnostic imaging of breast: Secondary | ICD-10-CM

## 2020-03-13 DIAGNOSIS — N6489 Other specified disorders of breast: Secondary | ICD-10-CM

## 2020-03-13 DIAGNOSIS — N6459 Other signs and symptoms in breast: Secondary | ICD-10-CM

## 2020-03-19 ENCOUNTER — Other Ambulatory Visit: Payer: Self-pay | Admitting: Obstetrics and Gynecology

## 2020-03-19 DIAGNOSIS — R921 Mammographic calcification found on diagnostic imaging of breast: Secondary | ICD-10-CM

## 2020-03-19 DIAGNOSIS — N6489 Other specified disorders of breast: Secondary | ICD-10-CM

## 2020-03-19 DIAGNOSIS — R928 Other abnormal and inconclusive findings on diagnostic imaging of breast: Secondary | ICD-10-CM

## 2020-04-01 ENCOUNTER — Ambulatory Visit
Admission: RE | Admit: 2020-04-01 | Discharge: 2020-04-01 | Disposition: A | Discharge status: Home / Self Care | Payer: BC Managed Care – PPO | Source: Ambulatory Visit | Attending: Obstetrics and Gynecology | Admitting: Obstetrics and Gynecology

## 2020-04-01 ENCOUNTER — Other Ambulatory Visit: Payer: Self-pay

## 2020-04-01 DIAGNOSIS — R928 Other abnormal and inconclusive findings on diagnostic imaging of breast: Secondary | ICD-10-CM

## 2020-04-01 DIAGNOSIS — R921 Mammographic calcification found on diagnostic imaging of breast: Secondary | ICD-10-CM | POA: Insufficient documentation

## 2020-04-01 DIAGNOSIS — N6489 Other specified disorders of breast: Secondary | ICD-10-CM | POA: Insufficient documentation

## 2020-04-02 ENCOUNTER — Other Ambulatory Visit: Payer: Self-pay | Admitting: Obstetrics and Gynecology

## 2020-04-02 DIAGNOSIS — R928 Other abnormal and inconclusive findings on diagnostic imaging of breast: Secondary | ICD-10-CM

## 2020-04-02 DIAGNOSIS — R921 Mammographic calcification found on diagnostic imaging of breast: Secondary | ICD-10-CM

## 2020-04-29 ENCOUNTER — Ambulatory Visit (INDEPENDENT_AMBULATORY_CARE_PROVIDER_SITE_OTHER): Payer: BC Managed Care – PPO | Admitting: Surgical

## 2020-04-29 ENCOUNTER — Other Ambulatory Visit: Payer: Self-pay

## 2020-04-29 VITALS — BP 125/75 | HR 74 | Ht 64.0 in | Wt 142.6 lb

## 2020-04-29 DIAGNOSIS — Z113 Encounter for screening for infections with a predominantly sexual mode of transmission: Secondary | ICD-10-CM | POA: Diagnosis not present

## 2020-04-29 DIAGNOSIS — Z3491 Encounter for supervision of normal pregnancy, unspecified, first trimester: Secondary | ICD-10-CM | POA: Diagnosis not present

## 2020-04-29 DIAGNOSIS — Z0283 Encounter for blood-alcohol and blood-drug test: Secondary | ICD-10-CM

## 2020-04-29 LAB — OB RESULTS CONSOLE VARICELLA ZOSTER ANTIBODY, IGG: Varicella: IMMUNE

## 2020-04-29 NOTE — Patient Instructions (Signed)
Common Medications Safe in Pregnancy  Acne:      Constipation:  Benzoyl Peroxide     Colace  Clindamycin      Dulcolax Suppository  Topica Erythromycin     Fibercon  Salicylic Acid      Metamucil         Miralax AVOID:        Senakot   Accutane    Cough:  Retin-A       Cough Drops  Tetracycline      Phenergan w/ Codeine if Rx  Minocycline      Robitussin (Plain & DM)  Antibiotics:     Crabs/Lice:  Ceclor       RID  Cephalosporins    AVOID:  E-Mycins      Kwell  Keflex  Macrobid/Macrodantin   Diarrhea:  Penicillin      Kao-Pectate  Zithromax      Imodium AD         PUSH FLUIDS AVOID:       Cipro     Fever:  Tetracycline      Tylenol (Regular or Extra  Minocycline       Strength)  Levaquin      Extra Strength-Do not          Exceed 8 tabs/24 hrs Caffeine:        <200mg/day (equiv. To 1 cup of coffee or  approx. 3 12 oz sodas)         Gas: Cold/Hayfever:       Gas-X  Benadryl      Mylicon  Claritin       Phazyme  **Claritin-D        Chlor-Trimeton    Headaches:  Dimetapp      ASA-Free Excedrin  Drixoral-Non-Drowsy     Cold Compress  Mucinex (Guaifenasin)     Tylenol (Regular or Extra  Sudafed/Sudafed-12 Hour     Strength)  **Sudafed PE Pseudoephedrine   Tylenol Cold & Sinus     Vicks Vapor Rub  Zyrtec  **AVOID if Problems With Blood Pressure         Heartburn: Avoid lying down for at least 1 hour after meals  Aciphex      Maalox     Rash:  Milk of Magnesia     Benadryl    Mylanta       1% Hydrocortisone Cream  Pepcid  Pepcid Complete   Sleep Aids:  Prevacid      Ambien   Prilosec       Benadryl  Rolaids       Chamomile Tea  Tums (Limit 4/day)     Unisom         Tylenol PM         Warm milk-add vanilla or  Hemorrhoids:       Sugar for taste  Anusol/Anusol H.C.  (RX: Analapram 2.5%)  Sugar Substitutes:  Hydrocortisone OTC     Ok in moderation  Preparation H      Tucks        Vaseline lotion applied to tissue with  wiping    Herpes:     Throat:  Acyclovir      Oragel  Famvir  Valtrex     Vaccines:         Flu Shot Leg Cramps:       *Gardasil  Benadryl      Hepatitis A         Hepatitis B Nasal Spray:         Pneumovax  Saline Nasal Spray     Polio Booster         Tetanus Nausea:       Tuberculosis test or PPD  Vitamin B6 25 mg TID   AVOID:    Dramamine      *Gardasil  Emetrol       Live Poliovirus  Ginger Root 250 mg QID    MMR (measles, mumps &  High Complex Carbs @ Bedtime    rebella)  Sea Bands-Accupressure    Varicella (Chickenpox)  Unisom 1/2 tab TID     *No known complications           If received before Pain:         Known pregnancy;   Darvocet       Resume series after  Lortab        Delivery  Percocet    Yeast:   Tramadol      Femstat  Tylenol 3      Gyne-lotrimin  Ultram       Monistat  Vicodin           MISC:         All Sunscreens           Hair Coloring/highlights          Insect Repellant's          (Including DEET)         Mystic Tans    First Trimester of Pregnancy  The first trimester of pregnancy is from week 1 until the end of week 13 (months 1 through 3). During this time, your baby will begin to develop inside you. At 6-8 weeks, the eyes and face are formed, and the heartbeat can be seen on ultrasound. At the end of 12 weeks, all the baby's organs are formed. Prenatal care is all the medical care you receive before the birth of your baby. Make sure you get good prenatal care and follow all of your doctor's instructions. Follow these instructions at home: Medicines  Take over-the-counter and prescription medicines only as told by your doctor. Some medicines are safe and some medicines are not safe during pregnancy.  Take a prenatal vitamin that contains at least 600 micrograms (mcg) of folic acid.  If you have trouble pooping (constipation), take medicine that will make your stool soft (stool softener) if your doctor approves. Eating and drinking   Eat  regular, healthy meals.  Your doctor will tell you the amount of weight gain that is right for you.  Avoid raw meat and uncooked cheese.  If you feel sick to your stomach (nauseous) or throw up (vomit): ? Eat 4 or 5 small meals a day instead of 3 large meals. ? Try eating a few soda crackers. ? Drink liquids between meals instead of during meals.  To prevent constipation: ? Eat foods that are high in fiber, like fresh fruits and vegetables, whole grains, and beans. ? Drink enough fluids to keep your pee (urine) clear or pale yellow. Activity  Exercise only as told by your doctor. Stop exercising if you have cramps or pain in your lower belly (abdomen) or low back.  Do not exercise if it is too hot, too humid, or if you are in a place of great height (high altitude).  Try to avoid standing for long periods of time. Move your legs often if you must stand in one place for a long time.  Avoid heavy lifting.  Wear  low-heeled shoes. Sit and stand up straight.  You can have sex unless your doctor tells you not to. Relieving pain and discomfort  Wear a good support bra if your breasts are sore.  Take warm water baths (sitz baths) to soothe pain or discomfort caused by hemorrhoids. Use hemorrhoid cream if your doctor says it is okay.  Rest with your legs raised if you have leg cramps or low back pain.  If you have puffy, bulging veins (varicose veins) in your legs: ? Wear support hose or compression stockings as told by your doctor. ? Raise (elevate) your feet for 15 minutes, 3-4 times a day. ? Limit salt in your food. Prenatal care  Schedule your prenatal visits by the twelfth week of pregnancy.  Write down your questions. Take them to your prenatal visits.  Keep all your prenatal visits as told by your doctor. This is important. Safety  Wear your seat belt at all times when driving.  Make a list of emergency phone numbers. The list should include numbers for family,  friends, the hospital, and police and fire departments. General instructions  Ask your doctor for a referral to a local prenatal class. Begin classes no later than at the start of month 6 of your pregnancy.  Ask for help if you need counseling or if you need help with nutrition. Your doctor can give you advice or tell you where to go for help.  Do not use hot tubs, steam rooms, or saunas.  Do not douche or use tampons or scented sanitary pads.  Do not cross your legs for long periods of time.  Avoid all herbs and alcohol. Avoid drugs that are not approved by your doctor.  Do not use any tobacco products, including cigarettes, chewing tobacco, and electronic cigarettes. If you need help quitting, ask your doctor. You may get counseling or other support to help you quit.  Avoid cat litter boxes and soil used by cats. These carry germs that can cause birth defects in the baby and can cause a loss of your baby (miscarriage) or stillbirth.  Visit your dentist. At home, brush your teeth with a soft toothbrush. Be gentle when you floss. Contact a doctor if:  You are dizzy.  You have mild cramps or pressure in your lower belly.  You have a nagging pain in your belly area.  You continue to feel sick to your stomach, you throw up, or you have watery poop (diarrhea).  You have a bad smelling fluid coming from your vagina.  You have pain when you pee (urinate).  You have increased puffiness (swelling) in your face, hands, legs, or ankles. Get help right away if:  You have a fever.  You are leaking fluid from your vagina.  You have spotting or bleeding from your vagina.  You have very bad belly cramping or pain.  You gain or lose weight rapidly.  You throw up blood. It may look like coffee grounds.  You are around people who have [place] measles, fifth disease, or chickenpox.  You have a very bad headache.  You have shortness of breath.  You have any kind of trauma, such as  from a fall or a car accident. Summary  The first trimester of pregnancy is from week 1 until the end of week 13 (months 1 through 3).  To take care of yourself and your unborn baby, you will need to eat healthy meals, take medicines only if your doctor tells you to do  so, and do activities that are safe for you and your baby.  Keep all follow-up visits as told by your doctor. This is important as your doctor will have to ensure that your baby is healthy and growing well. This information is not intended to replace advice given to you by your health care provider. Make sure you discuss any questions you have with your health care provider. Document Revised: 10/27/2018 Document Reviewed: 07/14/2016 Elsevier Patient Education  2020 [employer].

## 2020-04-29 NOTE — Progress Notes (Signed)
Orpah Cobb presents for NOB nurse interview visit. Pregnancy confirmation done 03/19/2020. G6. P1041. Pregnancy education material explained and given. 0 cats in home. NOB labs ordered. HIV labs and drug screen were explained and ordered. PNV encouraged. Genetic screening options discussed. Genetic testing: Unsure. Patient may discuss with the provider. Patient to follow up with provider on 05/15/2020 weeks for NOB physical. All questions answered.  FMLA and lab form signed.

## 2020-04-30 LAB — URINALYSIS, ROUTINE W REFLEX MICROSCOPIC
Bilirubin, UA: NEGATIVE
Glucose, UA: NEGATIVE
Ketones, UA: NEGATIVE
Leukocytes,UA: NEGATIVE
Nitrite, UA: NEGATIVE
Protein,UA: NEGATIVE
RBC, UA: NEGATIVE
Specific Gravity, UA: 1.007 (ref 1.005–1.030)
Urobilinogen, Ur: 0.2 mg/dL (ref 0.2–1.0)
pH, UA: 6.5 (ref 5.0–7.5)

## 2020-04-30 LAB — HIV ANTIBODY (ROUTINE TESTING W REFLEX): HIV Screen 4th Generation wRfx: NONREACTIVE

## 2020-04-30 LAB — ABO AND RH: Rh Factor: POSITIVE

## 2020-04-30 LAB — HEPATITIS B SURFACE ANTIGEN: Hepatitis B Surface Ag: NEGATIVE

## 2020-04-30 LAB — RPR: RPR Ser Ql: NONREACTIVE

## 2020-04-30 LAB — RUBELLA SCREEN: Rubella Antibodies, IGG: 15.5 index (ref >0.99)

## 2020-04-30 LAB — ANTIBODY SCREEN: Antibody Screen: NEGATIVE

## 2020-04-30 LAB — VARICELLA ZOSTER ANTIBODY, IGG: Varicella zoster IgG: 2475 index (ref >165)

## 2020-05-01 LAB — URINE CULTURE, OB REFLEX: Organism ID, Bacteria: NO GROWTH

## 2020-05-01 LAB — GC/CHLAMYDIA PROBE AMP
Chlamydia trachomatis, NAA: NEGATIVE
Neisseria Gonorrhoeae by PCR: NEGATIVE

## 2020-05-01 LAB — CULTURE, OB URINE

## 2020-05-02 LAB — MONITOR DRUG PROFILE 14(MW)
Amphetamine Scrn, Ur: NEGATIVE ng/mL
BARBITURATE SCREEN URINE: NEGATIVE ng/mL
BENZODIAZEPINE SCREEN, URINE: NEGATIVE ng/mL
Buprenorphine, Urine: NEGATIVE ng/mL
CANNABINOIDS UR QL SCN: NEGATIVE ng/mL
Cocaine (Metab) Scrn, Ur: NEGATIVE ng/mL
Creatinine(Crt), U: 19.3 mg/dL — ABNORMAL LOW (ref 20.0–300.0)
Fentanyl, Urine: NEGATIVE pg/mL
Meperidine Screen, Urine: NEGATIVE ng/mL
Methadone Screen, Urine: NEGATIVE ng/mL
OXYCODONE+OXYMORPHONE UR QL SCN: NEGATIVE ng/mL
Opiate Scrn, Ur: NEGATIVE ng/mL
Ph of Urine: 6.3 (ref 4.5–8.9)
Phencyclidine Qn, Ur: NEGATIVE ng/mL
Propoxyphene Scrn, Ur: NEGATIVE ng/mL
SPECIFIC GRAVITY: 1.0031
Tramadol Screen, Urine: NEGATIVE ng/mL

## 2020-05-07 ENCOUNTER — Telehealth: Payer: Self-pay

## 2020-05-07 NOTE — Telephone Encounter (Signed)
Called pt and went over her ob payment plan the pt verbally understood that its $80.00 co-pay each visit. The pt said that she will make her 1st payment on the 27th

## 2020-05-15 ENCOUNTER — Ambulatory Visit: Payer: BC Managed Care – PPO | Admitting: Obstetrics and Gynecology

## 2020-05-15 ENCOUNTER — Encounter: Payer: Self-pay | Admitting: Obstetrics and Gynecology

## 2020-05-15 ENCOUNTER — Other Ambulatory Visit: Payer: Self-pay

## 2020-05-15 VITALS — BP 119/79 | HR 70 | Ht 64.0 in | Wt 146.0 lb

## 2020-05-15 DIAGNOSIS — L299 Pruritus, unspecified: Secondary | ICD-10-CM

## 2020-05-15 DIAGNOSIS — O09521 Supervision of elderly multigravida, first trimester: Secondary | ICD-10-CM

## 2020-05-15 DIAGNOSIS — Z8742 Personal history of other diseases of the female genital tract: Secondary | ICD-10-CM

## 2020-05-15 DIAGNOSIS — E039 Hypothyroidism, unspecified: Secondary | ICD-10-CM

## 2020-05-15 DIAGNOSIS — Z3A12 12 weeks gestation of pregnancy: Secondary | ICD-10-CM

## 2020-05-15 DIAGNOSIS — O219 Vomiting of pregnancy, unspecified: Secondary | ICD-10-CM

## 2020-05-15 LAB — POCT URINALYSIS DIPSTICK OB
Bilirubin, UA: NEGATIVE
Blood, UA: NEGATIVE
Glucose, UA: NEGATIVE
Ketones, UA: NEGATIVE
Leukocytes, UA: NEGATIVE
Nitrite, UA: NEGATIVE
POC,PROTEIN,UA: NEGATIVE
Spec Grav, UA: <=1.005 — AB (ref 1.010–1.025)
Urobilinogen, UA: 0.2 E.U./dL
pH, UA: 6 (ref 5.0–8.0)

## 2020-05-15 NOTE — Progress Notes (Signed)
NOB-PE-Pt present for NOB PE. Pt stated that she was doing well no problems.

## 2020-05-15 NOTE — Progress Notes (Signed)
OBSTETRIC INITIAL PRENATAL VISIT  Subjective:    Tracy Gillespie is being seen today for her first obstetrical visit. She is a 49 y.o. [id number] female at [redacted]w[redacted]d gestation, Estimated Date of Delivery: 11/24/20 with last menstrual period 02/18/2020 (exact date). consistent with 5 week sono. This is a planned pregnancy, conceived through IVF (with egg donor) performed at [facility]. Currently using progesterone injections, notes she completes the last dose this evening.  Her obstetrical history is significant for advanced maternal age and history of infertility. Relationship with FOB: spouse, living together. Patient does intend to breast feed. Pregnancy history fully reviewed.   Of note, patient currently undergoing workup for abnormal mammogram.  Had screening mammogram in 02/2020, BIRADS-0, needing further evaluation of right breast, a possible asymmetry with associated microcalcifications was noted.  Had diagnostic mammogram and ultrasound of the fight breast in 03/2020 with findings of BIRADS 4: Suspicious, 5 mm group of indeterminate right breast calcifications noted. Recommendation is for stereotactic biopsy. Patient has opted to defer further workup and management until after her first trimester due to high risk nature of her pregnancy.    OB History  Gravida Para Term Preterm AB Living  6 1 1  0 4 1  SAB TAB Ectopic Multiple Live Births  1 0 0 1 1    # Outcome Date GA Lbr Len/2nd Weight Sex Delivery Anes PTL Lv  6A Gravida           6B Current           5 SAB 11/2018 [redacted]w[redacted]d         4 AB 09/2016          3 AB 08/2014          2 Term 11/14/12 [redacted]w[redacted]d   M Vag-Spont  N LIV  1 AB             Obstetric Comments  G2 - 2014  no problems during labor, retained placenta postpartum (had D&C 10 weeks postpartum).  Did have hypothyroidism.   G4 - 09/2016 has miscarriage of twins (6 weeks, but not diagnosed until 11 weeks).      Gynecologic History:  Last pap smear was 01/07/2018.   Results were normal.  Denies h/o abnormal pap smears in the past.  Denies history of STIs.  Contraception: None.    Past Medical History:  Diagnosis Date  . Miscarriage   . Seasonal allergies   . Thyroid disease     Family History  Problem Relation Age of Onset  . Breast cancer Neg Hx   . Ovarian cancer Neg Hx   . Stroke Neg Hx   . Osteoarthritis Neg Hx     Past Surgical History:  Procedure Laterality Date  . DILATION AND EVACUATION N/A 12/09/2016   Procedure: DILATATION AND EVACUATION;  Surgeon: 12/11/2016, MD;  Location: [facility] ORS;  Service: Gynecology;  Laterality: N/A;  . TONSILLECTOMY      Social History   Socioeconomic History  . Marital status: Significant Other    Spouse name: Not on file  . Number of children: Not on file  . Years of education: Not on file  . Highest education level: Not on file  Occupational History  . Not on file  Tobacco Use  . Smoking status: Never Smoker  . Smokeless tobacco: Never Used  Vaping Use  . Vaping Use: Never used  Substance and Sexual Activity  . Alcohol use: No  . Drug use: No  .  Sexual activity: Yes    Birth control/protection: None  Other Topics Concern  . Not on file  Social History Narrative  . Not on file   Social Determinants of Health   Financial Resource Strain:   . Difficulty of Paying Living Expenses: Not on file  Food Insecurity:   . Worried About Programme researcher, broadcasting/film/video in the Last Year: Not on file  . Ran Out of Food in the Last Year: Not on file  Transportation Needs:   . Lack of Transportation (Medical): Not on file  . Lack of Transportation (Non-Medical): Not on file  Physical Activity:   . Days of Exercise per Week: Not on file  . Minutes of Exercise per Session: Not on file  Stress:   . Feeling of Stress : Not on file  Social Connections:   . Frequency of Communication with Friends and Family: Not on file  . Frequency of Social Gatherings with Friends and Family: Not on file  . Attends  Religious Services: Not on file  . Active Member of Clubs or Organizations: Not on file  . Attends Banker Meetings: Not on file  . Marital Status: Not on file  Intimate Partner Violence:   . Fear of Current or Ex-Partner: Not on file  . Emotionally Abused: Not on file  . Physically Abused: Not on file  . Sexually Abused: Not on file    Current Outpatient Medications on File Prior to Visit  Medication Sig Dispense Refill  . azelastine (ASTELIN) 0.1 % nasal spray Place into both nostrils 2 (two) times daily. Use in each nostril as directed    . fluticasone (FLONASE) 50 MCG/ACT nasal spray Place into both nostrils daily.    Marland Kitchen levothyroxine (SYNTHROID) 100 MCG tablet Take 100 mcg by mouth daily before breakfast. m t t f s Monday, Tuesday,thursday Friday saturday    . levothyroxine (SYNTHROID) 150 MCG tablet Take 150 mcg by mouth daily before breakfast. Wednesday and sunday    . Prenatal Vit-Fe Fumarate-FA (MULTIVITAMIN-PRENATAL) 27-0.8 MG TABS tablet Take 1 tablet by mouth daily at 12 noon.    . progesterone (PROMETRIUM) 200 MG capsule Take 200 mg by mouth at bedtime.     . progesterone 50 MG/ML injection Inject into the muscle. (Patient not taking: Reported on 05/15/2020)     No current facility-administered medications on file prior to visit.    No Known Allergies   Review of Systems General: Not Present- Fever, Weight Loss and Weight Gain. Skin: Not Present- Rash.  Positive for itching under arms.  HEENT: Not Present- Blurred Vision, Headache and Bleeding Gums. Respiratory: Not Present- Difficulty Breathing. Breast: Not Present- Breast Mass. Cardiovascular: Not Present- Chest Pain, Elevated Blood Pressure, Fainting / Blacking Out and Shortness of Breath. Gastrointestinal: Not Present- Abdominal Pain, Constipation. Present - Nausea and Vomiting (mostly nausea, slowly improving over the past few weeks, concerned about diet as she is not able to eat as healthy as she  would like to).  Female Genitourinary: Not Present- Frequency, Painful Urination, Pelvic Pain, Vaginal Bleeding,  Contractions, regular, Fetal Movements Decreased, Urinary Complaints and Vaginal Fluid. Positive for - Vaginal Discharge, notes increase, but denies itching or burning.  Musculoskeletal: Not Present- Back Pain and Leg Cramps. Neurological: Not Present- Dizziness. Psychiatric: Not Present- Depression.     Objective:   Blood pressure 119/79, pulse 70, weight 146 lb (66.2 kg), last menstrual period 02/18/2020, unknown if currently breastfeeding.  Body mass index is 25.06 kg/m.  General Appearance:  Alert, cooperative, no distress, appears stated age.   Head:    Normocephalic, without obvious abnormality, atraumatic  Eyes:    PERRL, conjunctiva/corneas clear, EOM's intact, both eyes  Ears:    Normal external ear canals, both ears  Nose:   Nares normal, septum midline, mucosa normal, no drainage or sinus tenderness  Throat:   Lips, mucosa, and tongue normal; teeth and gums normal  Neck:   Supple, symmetrical, trachea midline, no adenopathy; thyroid: no enlargement/tenderness/nodules; no carotid bruit or JVD  Back:     Symmetric, no curvature, ROM normal, no CVA tenderness  Lungs:     Clear to auscultation bilaterally, respirations unlabored  Chest Wall:    No tenderness or deformity   Heart:    Regular rate and rhythm, S1 and S2 normal, no murmur, rub or gallop  Breast Exam:    No tenderness, masses, or nipple abnormality  Abdomen:     Soft, non-tender, bowel sounds active all four quadrants, no masses, no organomegaly. FHT 161bpm.  Genitalia:    Pelvic: Deferred, see exam from 01/16/2020 performed by me.  Pregnancy positive findings: uterine enlargement: 13 wk size, nontender.   Rectal:    Normal external sphincter.  No hemorrhoids appreciated. Internal exam not done.   Extremities:   Extremities normal, atraumatic, no cyanosis or edema  Pulses:   2+ and symmetric all extremities   Skin:   Skin color, texture, turgor normal, no rashes or lesions  Lymph nodes:   Cervical, supraclavicular, and axillary nodes normal  Neurologic:   CNII-XII intact, normal strength, sensation and reflexes throughout      Assessment:   1. Supervision of elderly multigravida (>=28 years old at time of delivery), first trimester   2. Newborn product of in vitro fertilization (IVF) pregnancy   3. [redacted] weeks gestation of pregnancy   4. History of infertility   5. Acquired hypothyroidism   6. Itching with irritation   7. Nausea and vomiting during pregnancy     Plan:   1. Supervision of high risk elderly pregnancy (IVF pregnancy, with egg donor) - Initial labs reviewed, also prior records from [facility] previously reviewed and scanned to [facility]. - Prenatal vitamins encouraged. Would also recommend taking an additional folic acid supplement.  - Problem list reviewed and updated. - New OB counseling:  The patient has been given an overview regarding routine prenatal care.  Recommendations regarding diet, weight gain, and exercise in pregnancy were given. - Prenatal testing, optional genetic testing, and ultrasound use in pregnancy were reviewed.  Cell-free DNA testing desired, will get Panorama. Has had other genetic screening with [facility] providers.  - Benefits of Breast Feeding were discussed. The patient is encouraged to consider nursing her baby post partum. - Flu vaccine to be given next visit.   2.  Newborn product of in vitro fertilization (IVF) pregnancy due to infertility - Patient is at increased risk for conditions in pregnancy such as gestational diabetes and hypertensive disorders of pregnancy due to IVF as well as age at current pregnancy. Would recommend initiation of a daily baby aspirin. ' - To continue progesterone, notes her last dose is later this evening.   3. Acquired hypothyroidism - Notes this is currently managed by her Endocrinologist. Currently on  Syntrhoid, which she can continue during the pregnancy. Will need growth scan at 32 weeks.   4. Nausea and vomiting during pregnancy - Notes nausea slowly improving. Given reassurance regarding current dietary intake (more carbs than she would  prefer to eat), informed that as nausea improves, she can return her normal healthier diet as tolerated. Using ginger products as needed. Also discussed use of sea bands. Has a prescription for Zofran given to her by her IVF specialist, but notes she will only take if symptoms become severe.   5. Itching with irritation - Mostly noted in axillary region, since onset of pregnancy. Notes she does shave daily, uses a shaving gel, showers with mild soaps and uses mild detergents. Tried stopping use of deodorant (also mild) but this didn't help so she resumed. Advised on decreasing frequency of shaving as her skin may be more sensitive due to hormones .   6. Abnormal mammogram  - Patient should consider completing additional workup after out of the first trimester for abnormal right breast imaging.  Recommendation is for stereotactic biopsy.   Follow up in 4 weeks.  75% of 30 min visit spent on counseling and coordination of care.    Hildred Laserherry, Vitoria Conyer, MD [facility]

## 2020-05-15 NOTE — Patient Instructions (Signed)

## 2020-05-16 ENCOUNTER — Encounter: Payer: Self-pay | Admitting: Obstetrics and Gynecology

## 2020-05-16 LAB — CBC
Hematocrit: 38.5 % (ref 34.0–46.6)
Hemoglobin: 12.8 g/dL (ref 11.1–15.9)
MCH: 31.1 pg (ref 26.6–33.0)
MCHC: 33.2 g/dL (ref 31.5–35.7)
MCV: 94 fL (ref 79–97)
Platelets: 301 10*3/uL (ref 150–450)
RBC: 4.11 x10E6/uL (ref 3.77–5.28)
RDW: 13.8 % (ref 11.7–15.4)
WBC: 9.1 10*3/uL (ref 3.4–10.8)

## 2020-06-03 ENCOUNTER — Telehealth: Payer: Self-pay

## 2020-06-03 NOTE — Telephone Encounter (Signed)
Pt called no answer LM via VM that her genetic testing results were negative and if she would like to know the sex of her baby to please contact the office. Pt is aware that mychart message will be sent to her also well.

## 2020-06-18 ENCOUNTER — Other Ambulatory Visit: Payer: Self-pay

## 2020-06-18 ENCOUNTER — Ambulatory Visit: Payer: BC Managed Care – PPO | Admitting: Obstetrics and Gynecology

## 2020-06-18 ENCOUNTER — Encounter: Payer: Self-pay | Admitting: Obstetrics and Gynecology

## 2020-06-18 VITALS — BP 117/71 | HR 76 | Wt 155.9 lb

## 2020-06-18 DIAGNOSIS — O09521 Supervision of elderly multigravida, first trimester: Secondary | ICD-10-CM

## 2020-06-18 DIAGNOSIS — Z23 Encounter for immunization: Secondary | ICD-10-CM

## 2020-06-18 DIAGNOSIS — Z3A17 17 weeks gestation of pregnancy: Secondary | ICD-10-CM

## 2020-06-18 LAB — POCT URINALYSIS DIPSTICK OB
Bilirubin, UA: NEGATIVE
Blood, UA: NEGATIVE
Glucose, UA: NEGATIVE
Ketones, UA: NEGATIVE
Leukocytes, UA: NEGATIVE
Nitrite, UA: NEGATIVE
POC,PROTEIN,UA: NEGATIVE
Spec Grav, UA: 1.015 (ref 1.010–1.025)
Urobilinogen, UA: 0.2 E.U./dL
pH, UA: 7 (ref 5.0–8.0)

## 2020-06-18 NOTE — Progress Notes (Signed)
ROB: Patient with no complaints.  Feels better now in the second trimester.  Taking prenatal vitamins and aspirin as directed.  Desires AFP today.  Patient received flu shot today.  Ultrasound FAS next visit.

## 2020-06-18 NOTE — Progress Notes (Signed)
Yellow

## 2020-06-18 NOTE — Addendum Note (Signed)
Addended by: Elonda Husky on: 06/18/2020 10:41 AM   Modules accepted: Orders

## 2020-06-20 LAB — AFP, SERUM, OPEN SPINA BIFIDA
AFP MoM: 1.91
AFP Value: 72.1 ng/mL
Gest. Age on Collection Date: 17 weeks
Maternal Age At EDD: 50.4 yr
OSBR Risk 1 IN: 987
Test Results:: NEGATIVE
Weight: 155 [lb_av]

## 2020-07-09 ENCOUNTER — Other Ambulatory Visit: Payer: Self-pay | Admitting: Obstetrics and Gynecology

## 2020-07-09 DIAGNOSIS — Z3492 Encounter for supervision of normal pregnancy, unspecified, second trimester: Secondary | ICD-10-CM

## 2020-07-10 ENCOUNTER — Encounter: Payer: Self-pay | Admitting: Obstetrics and Gynecology

## 2020-07-10 ENCOUNTER — Other Ambulatory Visit: Payer: Self-pay

## 2020-07-10 ENCOUNTER — Ambulatory Visit (INDEPENDENT_AMBULATORY_CARE_PROVIDER_SITE_OTHER): Payer: BC Managed Care – PPO

## 2020-07-10 ENCOUNTER — Ambulatory Visit: Payer: BC Managed Care – PPO | Admitting: Obstetrics and Gynecology

## 2020-07-10 VITALS — BP 92/59 | HR 83 | Wt 158.5 lb

## 2020-07-10 DIAGNOSIS — R102 Pelvic and perineal pain: Secondary | ICD-10-CM

## 2020-07-10 DIAGNOSIS — Z3492 Encounter for supervision of normal pregnancy, unspecified, second trimester: Secondary | ICD-10-CM

## 2020-07-10 DIAGNOSIS — O09522 Supervision of elderly multigravida, second trimester: Secondary | ICD-10-CM

## 2020-07-10 DIAGNOSIS — O26892 Other specified pregnancy related conditions, second trimester: Secondary | ICD-10-CM

## 2020-07-10 DIAGNOSIS — E039 Hypothyroidism, unspecified: Secondary | ICD-10-CM

## 2020-07-10 DIAGNOSIS — R12 Heartburn: Secondary | ICD-10-CM

## 2020-07-10 DIAGNOSIS — Z3A20 20 weeks gestation of pregnancy: Secondary | ICD-10-CM

## 2020-07-10 LAB — POCT URINALYSIS DIPSTICK OB
Bilirubin, UA: NEGATIVE
Blood, UA: NEGATIVE
Glucose, UA: NEGATIVE
Ketones, UA: NEGATIVE
Leukocytes, UA: NEGATIVE
Nitrite, UA: NEGATIVE
POC,PROTEIN,UA: NEGATIVE
Spec Grav, UA: 1.01 (ref 1.010–1.025)
Urobilinogen, UA: 0.2 E.U./dL
pH, UA: 6 (ref 5.0–8.0)

## 2020-07-10 NOTE — Progress Notes (Signed)
ROB-Pt present for routine prenatal care. Pt stated that she was doing well.  

## 2020-07-10 NOTE — Progress Notes (Signed)
ROB: Patient s/p anatomy scan, is normal. Is noting some heartburn, usually after eating full meals, but does improve with eating smaller portions. Also has Tums if needed. Notes nausea is finally resolving. Feeling good fetal movement. Plans to f/u with breast imaging after the holidays. Feels fetus low in pelvis, discussed use of belly band. Had visit with Endocrinologist, thyroid levels wnl. Sees every 4 weeks. RTC in 4 weeks.     The following were addressed during this visit:  Breastfeeding Education - Early initiation of breastfeeding    Comments: Keeps milk supply adequate, helps contract uterus and slow bleeding, and early milk is the perfect first food and is easy to digest.   - The importance of exclusive breastfeeding    Comments: Provides antibodies, Lower risk of breast and ovarian cancers, and type-2 diabetes,Helps your body recover, Reduced chance of SIDS.   - Frequent feeding to help assure optimal milk production    Comments: Making a full supply of milk requires frequent removal of milk from breasts, infant will eat 8-12 times in 24 hours, if separated from infant use breast massage, hand expression and/ or pumping to remove milk from breasts.   - Exclusive breastfeeding for the first 6 months    Comments: Builds a healthy milk supply and keeps it up, protects baby from sickness and disease, and breastmilk has everything your baby needs for the first 6 months.

## 2020-07-10 NOTE — Patient Instructions (Signed)

## 2020-07-20 NOTE — L&D Delivery Note (Signed)
       Delivery Note   Tracy Gillespie is a 50 y.o. [id number] at [redacted]w[redacted]d Estimated Date of Delivery: 11/29/20  PRE-OPERATIVE DIAGNOSIS:  1) [redacted]w[redacted]d pregnancy.  2) Labor 3) assisted reproductive technology  POST-OPERATIVE DIAGNOSIS:  1) [redacted]w[redacted]d pregnancy s/p Vaginal, Spontaneous  2) Viable female infant  Delivery Type: Vaginal, Spontaneous    Delivery Anesthesia: None   Labor Complications:      ESTIMATED BLOOD LOSS: 75  ml    FINDINGS:   1) female infant, Apgar scores of 8   at 1 minute and 9   at 5 minutes and a birthweight of   ounces.    2) Nuchal cord: No  SPECIMENS:   PLACENTA:   Appearance: Intact    Removal: Spontaneous      Disposition:    DISPOSITION:  Infant to left in stable condition in the delivery room, with L&D personnel and mother,  NARRATIVE SUMMARY: Labor course:  [name] is a [id number] at [redacted]w[redacted]d who presented for labor management.  She progressed well in labor without pitocin.  She rapdily proceeded to complete dilation. She evidenced good maternal expulsive effort during the second stage. She went on to deliver a viable infant. The placenta delivered but seemed to be more adherent than usual. Appearance of accessory lobe.  Seems complete. Will follow PP bleeding. A perineal and vaginal examination was performed. Episiotomy/Lacerations: 1st degree;Perineal  Episiotomy or lacerations were repaired with Vicryl suture using local anesthesia. The patient tolerated this well.  Elonda Husky, M.D. 11/23/2020 10:34 AM

## 2020-08-06 ENCOUNTER — Ambulatory Visit: Payer: BC Managed Care – PPO

## 2020-08-06 ENCOUNTER — Ambulatory Visit: Admission: RE | Admit: 2020-08-06 | Payer: BC Managed Care – PPO | Source: Ambulatory Visit

## 2020-08-08 ENCOUNTER — Ambulatory Visit (INDEPENDENT_AMBULATORY_CARE_PROVIDER_SITE_OTHER): Payer: BC Managed Care – PPO | Admitting: Obstetrics and Gynecology

## 2020-08-08 ENCOUNTER — Encounter: Payer: Self-pay | Admitting: Obstetrics and Gynecology

## 2020-08-08 ENCOUNTER — Other Ambulatory Visit: Payer: Self-pay

## 2020-08-08 VITALS — BP 119/78 | HR 81 | Wt 165.1 lb

## 2020-08-08 DIAGNOSIS — Z3A24 24 weeks gestation of pregnancy: Secondary | ICD-10-CM

## 2020-08-08 DIAGNOSIS — O09522 Supervision of elderly multigravida, second trimester: Secondary | ICD-10-CM

## 2020-08-08 NOTE — Progress Notes (Signed)
ROB: Patient doing well has no complaints.  She had an appointment regarding her mammogram.  She at that time decided not to have the biopsy done because of concerns regarding the clip placement.  She has an appointment next week and expects to get another opinion regarding necessity of biopsy and clip placement.  Feels daily fetal movement.  1 hour GCT next visit.

## 2020-08-13 ENCOUNTER — Ambulatory Visit
Admission: RE | Admit: 2020-08-13 | Discharge: 2020-08-13 | Disposition: A | Discharge status: Home / Self Care | Payer: BC Managed Care – PPO | Source: Ambulatory Visit | Attending: Obstetrics and Gynecology | Admitting: Obstetrics and Gynecology

## 2020-08-13 ENCOUNTER — Other Ambulatory Visit: Payer: Self-pay

## 2020-08-13 ENCOUNTER — Other Ambulatory Visit: Payer: Self-pay | Admitting: Obstetrics and Gynecology

## 2020-08-13 DIAGNOSIS — R921 Mammographic calcification found on diagnostic imaging of breast: Secondary | ICD-10-CM

## 2020-08-13 DIAGNOSIS — R928 Other abnormal and inconclusive findings on diagnostic imaging of breast: Secondary | ICD-10-CM | POA: Diagnosis present

## 2020-08-14 LAB — POCT URINALYSIS DIPSTICK OB
Bilirubin, UA: NEGATIVE
Blood, UA: NEGATIVE
Glucose, UA: NEGATIVE
Ketones, UA: NEGATIVE
Leukocytes, UA: NEGATIVE
Nitrite, UA: NEGATIVE
POC,PROTEIN,UA: NEGATIVE
Spec Grav, UA: 1.015 (ref 1.010–1.025)
Urobilinogen, UA: 0.2 E.U./dL
pH, UA: 6.5 (ref 5.0–8.0)

## 2020-08-14 NOTE — Addendum Note (Signed)
Addended by: Dorian Pod on: 08/14/2020 08:07 AM   Modules accepted: Orders

## 2020-09-02 ENCOUNTER — Telehealth: Payer: Self-pay

## 2020-09-02 NOTE — Telephone Encounter (Signed)
I guess that's fine.

## 2020-09-02 NOTE — Telephone Encounter (Signed)
Rescheduled patient to 09-10-2020 for 1 hour gt and ob after, lab is out of office 09-05-20- offered patient to come in on 2-16 and ob on 2-17, pt declined- unavailable. I wanted to make sure the 22nd will be ok for the lab, patient is aware I am checking with you.

## 2020-09-02 NOTE — Telephone Encounter (Signed)
Okay , I wanted to let you know

## 2020-09-05 ENCOUNTER — Other Ambulatory Visit: Payer: BC Managed Care – PPO

## 2020-09-05 ENCOUNTER — Encounter: Payer: BC Managed Care – PPO | Admitting: Obstetrics and Gynecology

## 2020-09-10 ENCOUNTER — Other Ambulatory Visit: Payer: BC Managed Care – PPO

## 2020-09-10 ENCOUNTER — Ambulatory Visit: Payer: BC Managed Care – PPO | Admitting: Obstetrics and Gynecology

## 2020-09-10 ENCOUNTER — Encounter: Payer: BC Managed Care – PPO | Admitting: Obstetrics and Gynecology

## 2020-09-10 ENCOUNTER — Other Ambulatory Visit: Payer: Self-pay

## 2020-09-10 VITALS — BP 101/68 | HR 78 | Wt 173.9 lb

## 2020-09-10 DIAGNOSIS — O2653 Maternal hypotension syndrome, third trimester: Secondary | ICD-10-CM

## 2020-09-10 DIAGNOSIS — Z87898 Personal history of other specified conditions: Secondary | ICD-10-CM

## 2020-09-10 DIAGNOSIS — Z23 Encounter for immunization: Secondary | ICD-10-CM

## 2020-09-10 DIAGNOSIS — E039 Hypothyroidism, unspecified: Secondary | ICD-10-CM

## 2020-09-10 DIAGNOSIS — Z3483 Encounter for supervision of other normal pregnancy, third trimester: Secondary | ICD-10-CM

## 2020-09-10 DIAGNOSIS — O09523 Supervision of elderly multigravida, third trimester: Secondary | ICD-10-CM

## 2020-09-10 LAB — POCT URINALYSIS DIPSTICK OB
Appearance: NORMAL
Bilirubin, UA: NEGATIVE
Blood, UA: NEGATIVE
Glucose, UA: NEGATIVE
Leukocytes, UA: NEGATIVE
Nitrite, UA: NEGATIVE
Odor: NORMAL
POC,PROTEIN,UA: NEGATIVE
Spec Grav, UA: 1.01 (ref 1.010–1.025)
Urobilinogen, UA: 0.2 E.U./dL
pH, UA: 6 (ref 5.0–8.0)

## 2020-09-10 NOTE — Progress Notes (Signed)
ROB: [redacted]w[redacted]d: She complains of dizziness that comes and goes, mostly at night. She has dizziness when she has quick sudden movement and going from sitting to standing. She has not noticed a pattern of dizziness.

## 2020-09-10 NOTE — Progress Notes (Signed)
ROB: Notes dizziness that comes and goes.  Noes it is mostly at night. Also noting it happens with quick movements. Likely due to maternal hypotension. Has had thyroid levels recently checked by Endocrinologist.  Advised on home treatment measures.  Notes that she is holding of on breast biopsy and other imaging after review with radiologist of previous imaging and noting stable calcifications. For 28 week labs today.  Likely will formula feeding but may consider pumping,  Desires no method for contraception (due to h/o infertility). For Tdap today, signed blood consent. RTC in 2 weeks.

## 2020-09-10 NOTE — Patient Instructions (Signed)
https://www.nichd.nih.gov/health/topics/labor-delivery/Pages/default.aspx">  Third Trimester of Pregnancy  The third trimester of pregnancy is from week 28 through week 40. This is months 7 through 9. The third trimester is a time when the unborn baby (fetus) is growing rapidly. At the end of the ninth month, the fetus is about 20 inches long and weighs 6-10 pounds. Body changes during your third trimester During the third trimester, your body will continue to go through many changes. The changes vary and generally return to normal after your baby is born. Physical changes  Your weight will continue to increase. You can expect to gain 25-35 pounds (11-16 kg) by the end of the pregnancy if you begin pregnancy at a normal weight. If you are underweight, you can expect to gain 28-40 lb (about 13-18 kg), and if you are overweight, you can expect to gain 15-25 lb (about 7-11 kg).  You may begin to get stretch marks on your hips, abdomen, and breasts.  Your breasts will continue to grow and may hurt. A yellow fluid (colostrum) may leak from your breasts. This is the first milk you are producing for your baby.  You may have changes in your hair. These can include thickening of your hair, rapid growth, and changes in texture. Some people also have hair loss during or after pregnancy, or hair that feels dry or thin.  Your belly button may stick out.  You may notice more swelling in your hands, face, or ankles. Health changes  You may have heartburn.  You may have constipation.  You may develop hemorrhoids.  You may develop swollen, bulging veins (varicose veins) in your legs.  You may have increased body aches in the pelvis, back, or thighs. This is due to weight gain and increased hormones that are relaxing your joints.  You may have increased tingling or numbness in your hands, arms, and legs. The skin on your abdomen may also feel numb.  You may feel short of breath because of your  expanding uterus. Other changes  You may urinate more often because the fetus is moving lower into your pelvis and pressing on your bladder.  You may have more problems sleeping. This may be caused by the size of your abdomen, an increased need to urinate, and an increase in your body's metabolism.  You may notice the fetus "dropping," or moving lower in your abdomen (lightening).  You may have increased vaginal discharge.  You may notice that you have pain around your pelvic bone as your uterus distends. Follow these instructions at home: Medicines  Follow your health care provider's instructions regarding medicine use. Specific medicines may be either safe or unsafe to take during pregnancy. Do not take any medicines unless approved by your health care provider.  Take a prenatal vitamin that contains at least 600 micrograms (mcg) of folic acid. Eating and drinking  Eat a healthy diet that includes fresh fruits and vegetables, whole grains, good sources of protein such as meat, eggs, or tofu, and low-fat dairy products.  Avoid raw meat and unpasteurized juice, milk, and cheese. These carry germs that can harm you and your baby.  Eat 4 or 5 small meals rather than 3 large meals a day.  You may need to take these actions to prevent or treat constipation: ? Drink enough fluid to keep your urine pale yellow. ? Eat foods that are high in fiber, such as beans, whole grains, and fresh fruits and vegetables. ? Limit foods that are high in fat and   processed sugars, such as fried or sweet foods. Activity  Exercise only as directed by your health care provider. Most people can continue their usual exercise routine during pregnancy. Try to exercise for 30 minutes at least 5 days a week. Stop exercising if you experience contractions in the uterus.  Stop exercising if you develop pain or cramping in the lower abdomen or lower back.  Avoid heavy lifting.  Do not exercise if it is very hot or  humid or if you are at a high altitude.  If you choose to, you may continue to have sex unless your health care provider tells you not to. Relieving pain and discomfort  Take frequent breaks and rest with your legs raised (elevated) if you have leg cramps or low back pain.  Take warm sitz baths to soothe any pain or discomfort caused by hemorrhoids. Use hemorrhoid cream if your health care provider approves.  Wear a supportive bra to prevent discomfort from breast tenderness.  If you develop varicose veins: ? Wear support hose as told by your health care provider. ? Elevate your feet for 15 minutes, 3-4 times a day. ? Limit salt in your diet. Safety  Talk to your health care provider before traveling far distances.  Do not use hot tubs, steam rooms, or saunas.  Wear your seat belt at all times when driving or riding in a car.  Talk with your health care provider if someone is verbally or physically abusive to you. Preparing for birth To prepare for the arrival of your baby:  Take prenatal classes to understand, practice, and ask questions about labor and delivery.  Visit the hospital and tour the maternity area.  Purchase a rear-facing car seat and make sure you know how to install it in your car.  Prepare the baby's room or sleeping area. Make sure to remove all pillows and stuffed animals from the baby's crib to prevent suffocation. General instructions  Avoid cat litter boxes and soil used by cats. These carry germs that can cause birth defects in the baby. If you have a cat, ask someone to clean the litter box for you.  Do not douche or use tampons. Do not use scented sanitary pads.  Do not use any products that contain nicotine or tobacco, such as cigarettes, e-cigarettes, and chewing tobacco. If you need help quitting, ask your health care provider.  Do not use any herbal remedies, illegal drugs, or medicines that were not prescribed to you. Chemicals in these products  can harm your baby.  Do not drink alcohol.  You will have more frequent prenatal exams during the third trimester. During a routine prenatal visit, your health care provider will do a physical exam, perform tests, and discuss your overall health. Keep all follow-up visits. This is important. Where to find more information  American Pregnancy Association: americanpregnancy.org  American College of Obstetricians and Gynecologists: acog.org/en/Womens%20Health/Pregnancy  Office on Women's Health: womenshealth.gov/pregnancy Contact a health care provider if you have:  A fever.  Mild pelvic cramps, pelvic pressure, or nagging pain in your abdominal area or lower back.  Vomiting or diarrhea.  Bad-smelling vaginal discharge or foul-smelling urine.  Pain when you urinate.  A headache that does not go away when you take medicine.  Visual changes or see spots in front of your eyes. Get help right away if:  Your water breaks.  You have regular contractions less than 5 minutes apart.  You have spotting or bleeding from your vagina.  You   have severe abdominal pain.  You have difficulty breathing.  You have chest pain.  You have fainting spells.  You have not felt your baby move for the time period told by your health care provider.  You have new or increased pain, swelling, or redness in an arm or leg. Summary  The third trimester of pregnancy is from week 28 through week 40 (months 7 through 9).  You may have more problems sleeping. This can be caused by the size of your abdomen, an increased need to urinate, and an increase in your body's metabolism.  You will have more frequent prenatal exams during the third trimester. Keep all follow-up visits. This is important. This information is not intended to replace advice given to you by your health care provider. Make sure you discuss any questions you have with your health care provider. Document Revised: 12/13/2019 Document  Reviewed: 10/19/2019 Elsevier Patient Education  2021 [employer]     Orthostatic Hypotension Blood pressure is a measurement of how strongly, or weakly, your blood is pressing against the walls of your arteries. Orthostatic hypotension is a sudden drop in blood pressure that happens when you quickly change positions, such as when you get up from sitting or lying down. Arteries are blood vessels that carry blood from your heart throughout your body. When blood pressure is too low, you may not get enough blood to your brain or to the rest of your organs. This can cause weakness, light-headedness, rapid heartbeat, and fainting. This can last for just a few seconds or for up to a few minutes. Orthostatic hypotension is usually not a serious problem. However, if it happens frequently or gets worse, it may be a sign of something more serious. What are the causes? This condition may be caused by:  Sudden changes in posture, such as standing up quickly after you have been sitting or lying down.  Blood loss.  Loss of body fluids (dehydration).  Heart problems.  Hormone (endocrine) problems.  Pregnancy.  Severe infection.  Lack of certain nutrients.  Severe allergic reactions (anaphylaxis).  Certain medicines, such as blood pressure medicine or medicines that make the body lose excess fluids (diuretics). Sometimes, this condition can be caused by not taking medicine as directed, such as taking too much of a certain medicine. What increases the risk? The following factors may make you more likely to develop this condition:  Age. Risk increases as you get older.  Conditions that affect the heart or the central nervous system.  Taking certain medicines, such as blood pressure medicine or diuretics.  Being pregnant. What are the signs or symptoms? Symptoms of this condition may include:  Weakness.  Light-headedness.  Dizziness.  Blurred vision.  Fatigue.  Rapid  heartbeat.  Fainting, in severe cases. How is this diagnosed? This condition is diagnosed based on:  Your medical history.  Your symptoms.  Your blood pressure measurement. Your health care provider will check your blood pressure when you are: ? Lying down. ? Sitting. ? Standing. A blood pressure reading is recorded as two numbers, such as "120 over 80" (or 120/80). The first ("top") number is called the systolic pressure. It is a measure of the pressure in your arteries as your heart beats. The second ("bottom") number is called the diastolic pressure. It is a measure of the pressure in your arteries when your heart relaxes between beats. Blood pressure is measured in a unit called mm Hg. Healthy blood pressure for most adults is 120/80.  If your blood pressure is below 90/60, you may be diagnosed with hypotension. Other information or tests that may be used to diagnose orthostatic hypotension include:  Your other vital signs, such as your heart rate and temperature.  Blood tests.  Tilt table test. For this test, you will be safely secured to a table that moves you from a lying position to an upright position. Your heart rhythm and blood pressure will be monitored during the test. How is this treated? This condition may be treated by:  Changing your diet. This may involve eating more salt (sodium) or drinking more water.  Taking medicines to raise your blood pressure.  Changing the dosage of certain medicines you are taking that might be lowering your blood pressure.  Wearing compression stockings. These stockings help to prevent blood clots and reduce swelling in your legs. In some cases, you may need to go to the hospital for:  Fluid replacement. This means you will receive fluids through an IV.  Blood replacement. This means you will receive donated blood through an IV (transfusion).  Treating an infection or heart problems, if this applies.  Monitoring. You may need to be  monitored while medicines that you are taking wear off. Follow these instructions at home: Eating and drinking  Drink enough fluid to keep your urine pale yellow.  Eat a healthy diet, and follow instructions from your health care provider about eating or drinking restrictions. A healthy diet includes: ? Fresh fruits and vegetables. ? Whole grains. ? Lean meats. ? Low-fat dairy products.  Eat extra salt only as directed. Do not add extra salt to your diet unless your health care provider told you to do that.  Eat frequent, small meals.  Avoid standing up suddenly after eating.   Medicines  Take over-the-counter and prescription medicines only as told by your health care provider. ? Follow instructions from your health care provider about changing the dosage of your current medicines, if this applies. ? Do not stop or adjust any of your medicines on your own. General instructions  Wear compression stockings as told by your health care provider.  Get up slowly from lying down or sitting positions. This gives your blood pressure a chance to adjust.  Avoid hot showers and excessive heat as directed by your health care provider.  Return to your normal activities as told by your health care provider. Ask your health care provider what activities are safe for you.  Do not use any products that contain nicotine or tobacco, such as cigarettes, e-cigarettes, and chewing tobacco. If you need help quitting, ask your health care provider.  Keep all follow-up visits as told by your health care provider. This is important.   Contact a health care provider if you:  Vomit.  Have diarrhea.  Have a fever for more than 2-3 days.  Feel more thirsty than usual.  Feel weak and tired. Get help right away if you:  Have chest pain.  Have a fast or irregular heartbeat.  Develop numbness in any part of your body.  Cannot move your arms or your legs.  Have trouble speaking.  Become sweaty  or feel light-headed.  Faint.  Feel short of breath.  Have trouble staying awake.  Feel confused. Summary  Orthostatic hypotension is a sudden drop in blood pressure that happens when you quickly change positions.  Orthostatic hypotension is usually not a serious problem.  It is diagnosed by having your blood pressure taken lying down, sitting, and  then standing.  It may be treated by changing your diet or adjusting your medicines. This information is not intended to replace advice given to you by your health care provider. Make sure you discuss any questions you have with your health care provider. Document Revised: 12/30/2017 Document Reviewed: 12/30/2017 Elsevier Patient Education  2021 [employer].

## 2020-09-11 LAB — CBC
Hematocrit: 32.7 % — ABNORMAL LOW (ref 34.0–46.6)
Hemoglobin: 11.2 g/dL (ref 11.1–15.9)
MCH: 31.7 pg (ref 26.6–33.0)
MCHC: 34.3 g/dL (ref 31.5–35.7)
MCV: 93 fL (ref 79–97)
Platelets: 289 10*3/uL (ref 150–450)
RBC: 3.53 x10E6/uL — ABNORMAL LOW (ref 3.77–5.28)
RDW: 13 % (ref 11.7–15.4)
WBC: 11.1 10*3/uL — ABNORMAL HIGH (ref 3.4–10.8)

## 2020-09-11 LAB — RPR: RPR Ser Ql: NONREACTIVE

## 2020-09-11 LAB — GLUCOSE, 1 HOUR GESTATIONAL: Gestational Diabetes Screen: 75 mg/dL (ref 65–139)

## 2020-09-24 ENCOUNTER — Encounter: Payer: Self-pay | Admitting: Obstetrics and Gynecology

## 2020-09-24 ENCOUNTER — Ambulatory Visit (INDEPENDENT_AMBULATORY_CARE_PROVIDER_SITE_OTHER): Payer: BC Managed Care – PPO | Admitting: Obstetrics and Gynecology

## 2020-09-24 ENCOUNTER — Other Ambulatory Visit: Payer: Self-pay

## 2020-09-24 VITALS — BP 126/72 | HR 73 | Wt 173.5 lb

## 2020-09-24 DIAGNOSIS — Z3A31 31 weeks gestation of pregnancy: Secondary | ICD-10-CM

## 2020-09-24 DIAGNOSIS — O09523 Supervision of elderly multigravida, third trimester: Secondary | ICD-10-CM

## 2020-09-24 LAB — POCT URINALYSIS DIPSTICK OB
Bilirubin, UA: NEGATIVE
Blood, UA: NEGATIVE
Glucose, UA: NEGATIVE
Ketones, UA: NEGATIVE
Leukocytes, UA: NEGATIVE
Nitrite, UA: NEGATIVE
POC,PROTEIN,UA: NEGATIVE
Spec Grav, UA: 1.015 (ref 1.010–1.025)
Urobilinogen, UA: 0.2 E.U./dL
pH, UA: 6.5 (ref 5.0–8.0)

## 2020-09-24 NOTE — Progress Notes (Signed)
ROB: Patient continues to have some vertigo but she states it has become better in the last few weeks.  It mainly now occurs when rolling over in bed.  Discussed possible use of Claritin.  Patient reports very active fetal movement.  Taking aspirin and vitamins as directed.

## 2020-10-08 ENCOUNTER — Encounter: Payer: Self-pay | Admitting: Obstetrics and Gynecology

## 2020-10-08 ENCOUNTER — Other Ambulatory Visit: Payer: Self-pay

## 2020-10-08 ENCOUNTER — Ambulatory Visit (INDEPENDENT_AMBULATORY_CARE_PROVIDER_SITE_OTHER): Payer: BC Managed Care – PPO | Admitting: Obstetrics and Gynecology

## 2020-10-08 VITALS — BP 106/68 | HR 73 | Wt 177.5 lb

## 2020-10-08 DIAGNOSIS — O09523 Supervision of elderly multigravida, third trimester: Secondary | ICD-10-CM

## 2020-10-08 DIAGNOSIS — Z3A33 33 weeks gestation of pregnancy: Secondary | ICD-10-CM

## 2020-10-08 DIAGNOSIS — E039 Hypothyroidism, unspecified: Secondary | ICD-10-CM

## 2020-10-08 LAB — POCT URINALYSIS DIPSTICK OB
Bilirubin, UA: NEGATIVE
Blood, UA: NEGATIVE
Glucose, UA: NEGATIVE
Ketones, UA: NEGATIVE
Leukocytes, UA: NEGATIVE
Nitrite, UA: NEGATIVE
POC,PROTEIN,UA: NEGATIVE
Spec Grav, UA: <=1.005 — AB (ref 1.010–1.025)
Urobilinogen, UA: 0.2 E.U./dL
pH, UA: 6 (ref 5.0–8.0)

## 2020-10-08 NOTE — Progress Notes (Signed)
ROB: Reports feeling more fatigue. Still trying to stay fairly active. Sometimes has sleep disturbances. Had thyroid levels checked last week, stable. No change in medications. Discussed pain management in labor, desires natural.  Discussed breastfeeding further. No contraception as h/o infertility. Discussed antenatal testing beginning at 36 weeks. Also for growth scan then.

## 2020-10-08 NOTE — Patient Instructions (Addendum)
https://www.nichd.nih.gov/health/topics/labor-delivery/Pages/default.aspx">  Third Trimester of Pregnancy  The third trimester of pregnancy is from week 28 through week 40. This is months 7 through 9. The third trimester is a time when the unborn baby (fetus) is growing rapidly. At the end of the ninth month, the fetus is about 20 inches long and weighs 6-10 pounds. Body changes during your third trimester During the third trimester, your body will continue to go through many changes. The changes vary and generally return to normal after your baby is born. Physical changes  Your weight will continue to increase. You can expect to gain 25-35 pounds (11-16 kg) by the end of the pregnancy if you begin pregnancy at a normal weight. If you are underweight, you can expect to gain 28-40 lb (about 13-18 kg), and if you are overweight, you can expect to gain 15-25 lb (about 7-11 kg).  You may begin to get stretch marks on your hips, abdomen, and breasts.  Your breasts will continue to grow and may hurt. A yellow fluid (colostrum) may leak from your breasts. This is the first milk you are producing for your baby.  You may have changes in your hair. These can include thickening of your hair, rapid growth, and changes in texture. Some people also have hair loss during or after pregnancy, or hair that feels dry or thin.  Your belly button may stick out.  You may notice more swelling in your hands, face, or ankles. Health changes  You may have heartburn.  You may have constipation.  You may develop hemorrhoids.  You may develop swollen, bulging veins (varicose veins) in your legs.  You may have increased body aches in the pelvis, back, or thighs. This is due to weight gain and increased hormones that are relaxing your joints.  You may have increased tingling or numbness in your hands, arms, and legs. The skin on your abdomen may also feel numb.  You may feel short of breath because of your  expanding uterus. Other changes  You may urinate more often because the fetus is moving lower into your pelvis and pressing on your bladder.  You may have more problems sleeping. This may be caused by the size of your abdomen, an increased need to urinate, and an increase in your body's metabolism.  You may notice the fetus "dropping," or moving lower in your abdomen (lightening).  You may have increased vaginal discharge.  You may notice that you have pain around your pelvic bone as your uterus distends. Follow these instructions at home: Medicines  Follow your health care provider's instructions regarding medicine use. Specific medicines may be either safe or unsafe to take during pregnancy. Do not take any medicines unless approved by your health care provider.  Take a prenatal vitamin that contains at least 600 micrograms (mcg) of folic acid. Eating and drinking  Eat a healthy diet that includes fresh fruits and vegetables, whole grains, good sources of protein such as meat, eggs, or tofu, and low-fat dairy products.  Avoid raw meat and unpasteurized juice, milk, and cheese. These carry germs that can harm you and your baby.  Eat 4 or 5 small meals rather than 3 large meals a day.  You may need to take these actions to prevent or treat constipation: ? Drink enough fluid to keep your urine pale yellow. ? Eat foods that are high in fiber, such as beans, whole grains, and fresh fruits and vegetables. ? Limit foods that are high in fat and   processed sugars, such as fried or sweet foods. Activity  Exercise only as directed by your health care provider. Most people can continue their usual exercise routine during pregnancy. Try to exercise for 30 minutes at least 5 days a week. Stop exercising if you experience contractions in the uterus.  Stop exercising if you develop pain or cramping in the lower abdomen or lower back.  Avoid heavy lifting.  Do not exercise if it is very hot or  humid or if you are at a high altitude.  If you choose to, you may continue to have sex unless your health care provider tells you not to. Relieving pain and discomfort  Take frequent breaks and rest with your legs raised (elevated) if you have leg cramps or low back pain.  Take warm sitz baths to soothe any pain or discomfort caused by hemorrhoids. Use hemorrhoid cream if your health care provider approves.  Wear a supportive bra to prevent discomfort from breast tenderness.  If you develop varicose veins: ? Wear support hose as told by your health care provider. ? Elevate your feet for 15 minutes, 3-4 times a day. ? Limit salt in your diet. Safety  Talk to your health care provider before traveling far distances.  Do not use hot tubs, steam rooms, or saunas.  Wear your seat belt at all times when driving or riding in a car.  Talk with your health care provider if someone is verbally or physically abusive to you. Preparing for birth To prepare for the arrival of your baby:  Take prenatal classes to understand, practice, and ask questions about labor and delivery.  Visit the hospital and tour the maternity area.  Purchase a rear-facing car seat and make sure you know how to install it in your car.  Prepare the baby's room or sleeping area. Make sure to remove all pillows and stuffed animals from the baby's crib to prevent suffocation. General instructions  Avoid cat litter boxes and soil used by cats. These carry germs that can cause birth defects in the baby. If you have a cat, ask someone to clean the litter box for you.  Do not douche or use tampons. Do not use scented sanitary pads.  Do not use any products that contain nicotine or tobacco, such as cigarettes, e-cigarettes, and chewing tobacco. If you need help quitting, ask your health care provider.  Do not use any herbal remedies, illegal drugs, or medicines that were not prescribed to you. Chemicals in these products  can harm your baby.  Do not drink alcohol.  You will have more frequent prenatal exams during the third trimester. During a routine prenatal visit, your health care provider will do a physical exam, perform tests, and discuss your overall health. Keep all follow-up visits. This is important. Where to find more information  American Pregnancy Association: americanpregnancy.org  American College of Obstetricians and Gynecologists: acog.org/en/Womens%20Health/Pregnancy  Office on Women's Health: womenshealth.gov/pregnancy Contact a health care provider if you have:  A fever.  Mild pelvic cramps, pelvic pressure, or nagging pain in your abdominal area or lower back.  Vomiting or diarrhea.  Bad-smelling vaginal discharge or foul-smelling urine.  Pain when you urinate.  A headache that does not go away when you take medicine.  Visual changes or see spots in front of your eyes. Get help right away if:  Your water breaks.  You have regular contractions less than 5 minutes apart.  You have spotting or bleeding from your vagina.  You   have severe abdominal pain.  You have difficulty breathing.  You have chest pain.  You have fainting spells.  You have not felt your baby move for the time period told by your health care provider.  You have new or increased pain, swelling, or redness in an arm or leg. Summary  The third trimester of pregnancy is from week 28 through week 40 (months 7 through 9).  You may have more problems sleeping. This can be caused by the size of your abdomen, an increased need to urinate, and an increase in your body's metabolism.  You will have more frequent prenatal exams during the third trimester. Keep all follow-up visits. This is important. This information is not intended to replace advice given to you by your health care provider. Make sure you discuss any questions you have with your health care provider. Document Revised: 12/13/2019 Document  Reviewed: 10/19/2019 Elsevier Patient Education  2021 Elsevier Inc.   Nonstress Test A nonstress test is a procedure that is done during pregnancy in order to check the baby's heartbeat. The procedure can help to show if the baby (fetus) is healthy. It is commonly done when:  The baby is past his or her due date.  The pregnancy is high risk.  The baby is moving less than normal.  The mother has lost a pregnancy in the past.  The health care provider suspects a problem with the baby's growth.  There is too much or too little amniotic fluid. The procedure is often done in the third trimester of pregnancy to find out if an early delivery is needed and whether such a delivery is safe. During a nonstress test, the baby's heartbeat is monitored when the baby is resting and when the baby is moving. If the baby is healthy, the heart rate will increase when he or she moves or kicks and will return to normal when he or she rests. Tell a health care provider about:  Any allergies you have.  Any medical conditions you have.  All medicines you are taking, including vitamins, herbs, eye drops, creams, and over-the-counter medicines.  Any surgeries you have had.  Any past pregnancies you have had. What are the risks? There are no risks to you or your baby from a nonstress test. This procedure should not be painful or uncomfortable. What happens before the procedure?  Eat a meal right before the test or as directed by your health care provider. Food may help encourage the baby to move.  Use the restroom right before the test. What happens during the procedure?  Two monitors will be placed on your abdomen. One will record the baby's heart rate and the other will record the contractions of your uterus.  You may be asked to lie down on your side or to sit upright.  You may be given a button to press when you feel your baby move.  Your health care provider will listen to your baby's  heartbeat and record it. He or she may also watch your baby's heartbeat on a screen.  If the baby seems to be sleeping, you may be asked to drink some juice or soda, eat a snack, or change positions. The procedure may vary among health care providers and hospitals.   What can I expect after procedure?  Your health care provider will discuss the test results with you and make recommendations for the future. Depending on the results, your health care provider may order additional tests or another course   of action.  If your health care provider gave you any diet or activity instructions, make sure to follow them.  Keep all follow-up visits. This is important. Summary  A nonstress test is a procedure that is done during pregnancy in order to check the baby's heartbeat. The procedure can help show if the baby is healthy.  The procedure is often done in the third trimester of pregnancy to find out if an early delivery is needed and whether such a delivery is safe.  During a nonstress test, the baby's heartbeat is monitored when the baby is resting and when the baby is moving. If the baby is healthy, the heart rate will increase when he or she moves or kicks and will return to normal when he or she rests.  Your health care provider will discuss the test results with you and make recommendations for the future. This information is not intended to replace advice given to you by your health care provider. Make sure you discuss any questions you have with your health care provider. Document Revised: 04/15/2020 Document Reviewed: 04/15/2020 Elsevier Patient Education  2021 Elsevier Inc.  

## 2020-10-08 NOTE — Progress Notes (Signed)
OB-Pt present for routine prenatal care. pt co lower abd pain

## 2020-10-09 ENCOUNTER — Telehealth: Payer: Self-pay | Admitting: Obstetrics and Gynecology

## 2020-10-09 NOTE — Telephone Encounter (Signed)
Please advise. Thanks Advait Buice 

## 2020-10-09 NOTE — Telephone Encounter (Signed)
New message   Office called and wanted the MD to be aware of the phone conversation with patient.   Patient called an voiced her son has a dentist appointment around the same time as her ultrasound 4.19.22. @ 3 pm   Office informed patient that appt would be move up due to MD request.   Office is holding off on canceling appointment until they speak with MD.

## 2020-10-10 ENCOUNTER — Telehealth: Payer: Self-pay | Admitting: Obstetrics and Gynecology

## 2020-10-10 NOTE — Telephone Encounter (Signed)
Toni Amend,   Please see message above from my office staff trying to have one of our patient's scheduled at [facility] for ultrasound. Although I understand that there is a great demand and short supply for ultrasound, I hope that we are not treating the patients like this.  Could you help me in getting this patient scheduled in a timely fashion.   Thanks,   Dr. Valentino Saxon

## 2020-10-10 NOTE — Telephone Encounter (Signed)
I've sent a message to the ultrasound scheduler at [facility].

## 2020-10-10 NOTE — Telephone Encounter (Signed)
Called patient this morning to make sure she was aware of her [place] scheduled at [facility] on 11-05-2020 at 3 pm, pt actually has seen on her mychart and called our office yesterday evening about rescheduling- we provided the phone # to scheduling and explained we do not have access to their scheduled. Pt states that the lady she encountered was very "nasty" she told patient if she could not make this apt then she might not get one at all. Pt was very upset by the schedulers manner and unwillingness to assist her or be understanding. Pt is aware of the staffing issues with ultrasound but had apt for her son already arranged that she can not reschedule. I sympathized with patient and apologized for the schedulers behavior, made her aware that I would reach out to Dr.Cherry make her aware and see how she suggest to proceed. Pt is understanding. Pt mentioned that she is willing to drive to get ultrasound  At a more convient date. Please Advise.

## 2020-10-22 NOTE — Telephone Encounter (Signed)
LMTRC.   Spoke with [facility] scheduling today and last week. They have this pt on a list to move her up if any way possible. They are doing all they can do to get pts seen in a timely fashion.

## 2020-10-22 NOTE — Telephone Encounter (Signed)
Pt aware.   Pt did receive a call from [place] last week with [facility]. Toni Amend apologized for the receptionist being rude. Pt is on a list of pts that Toni Amend is trying to get seen sooner. Pt was appreciative of call.

## 2020-10-23 ENCOUNTER — Encounter: Payer: Self-pay | Admitting: Obstetrics and Gynecology

## 2020-10-23 ENCOUNTER — Ambulatory Visit (INDEPENDENT_AMBULATORY_CARE_PROVIDER_SITE_OTHER): Payer: BC Managed Care – PPO | Admitting: Obstetrics and Gynecology

## 2020-10-23 ENCOUNTER — Other Ambulatory Visit: Payer: Self-pay

## 2020-10-23 VITALS — BP 116/74 | HR 70 | Wt 180.7 lb

## 2020-10-23 DIAGNOSIS — Z3A35 35 weeks gestation of pregnancy: Secondary | ICD-10-CM

## 2020-10-23 DIAGNOSIS — O09523 Supervision of elderly multigravida, third trimester: Secondary | ICD-10-CM

## 2020-10-23 NOTE — Progress Notes (Signed)
ROB: No complaints.  Generally feels well.  Continues to walk for exercise.  Reports daily fetal movement.  Begin NSTs next visit.  Cultures next visit.

## 2020-10-25 LAB — POCT URINALYSIS DIPSTICK OB
Bilirubin, UA: NEGATIVE
Blood, UA: NEGATIVE
Glucose, UA: NEGATIVE
Ketones, UA: NEGATIVE
Leukocytes, UA: NEGATIVE
Nitrite, UA: NEGATIVE
POC,PROTEIN,UA: NEGATIVE
Spec Grav, UA: <=1.005 — AB (ref 1.010–1.025)
Urobilinogen, UA: 0.2 E.U./dL
pH, UA: 6.5 (ref 5.0–8.0)

## 2020-10-28 NOTE — Progress Notes (Signed)
OB-Pt -present for routine prenatal care, 36 week cultures and NST. Pt stated having lower abd pressure and lower back pain.

## 2020-10-29 ENCOUNTER — Other Ambulatory Visit: Payer: BC Managed Care – PPO

## 2020-10-29 ENCOUNTER — Other Ambulatory Visit: Payer: Self-pay

## 2020-10-29 ENCOUNTER — Ambulatory Visit (INDEPENDENT_AMBULATORY_CARE_PROVIDER_SITE_OTHER): Payer: BC Managed Care – PPO | Admitting: Obstetrics and Gynecology

## 2020-10-29 ENCOUNTER — Encounter: Payer: Self-pay | Admitting: Obstetrics and Gynecology

## 2020-10-29 VITALS — BP 112/71 | HR 65 | Wt 180.1 lb

## 2020-10-29 DIAGNOSIS — E039 Hypothyroidism, unspecified: Secondary | ICD-10-CM

## 2020-10-29 DIAGNOSIS — O09523 Supervision of elderly multigravida, third trimester: Secondary | ICD-10-CM | POA: Diagnosis not present

## 2020-10-29 DIAGNOSIS — Z3A36 36 weeks gestation of pregnancy: Secondary | ICD-10-CM

## 2020-10-29 LAB — POCT URINALYSIS DIPSTICK OB
Bilirubin, UA: NEGATIVE
Blood, UA: NEGATIVE
Glucose, UA: NEGATIVE
Ketones, UA: NEGATIVE
Leukocytes, UA: NEGATIVE
Nitrite, UA: NEGATIVE
POC,PROTEIN,UA: NEGATIVE
Spec Grav, UA: <=1.005 — AB (ref 1.010–1.025)
Urobilinogen, UA: 0.2 E.U./dL
pH, UA: 6 (ref 5.0–8.0)

## 2020-10-29 NOTE — Progress Notes (Signed)
ROB: Patient doing well, noting lower abdominal pain and pressure. Is for growth scan next week with [facility]. 36 week cultures done.  NST performed today was reviewed and was found to be reactive.  Continue recommended antenatal testing and prenatal care.   NONSTRESS TEST INTERPRETATION  INDICATIONS:  AMA, Hypothyroidism.   FHR baseline: 145 bpm RESULTS:Reactive COMMENTS: No contractions   PLAN: 1. Continue fetal kick counts twice a day. 2. Continue antepartum testing as scheduled-weekly

## 2020-10-31 LAB — GC/CHLAMYDIA PROBE AMP
Chlamydia trachomatis, NAA: NEGATIVE
Neisseria Gonorrhoeae by PCR: NEGATIVE

## 2020-10-31 LAB — STREP GP B NAA: Strep Gp B NAA: POSITIVE — AB

## 2020-10-31 NOTE — Telephone Encounter (Signed)
Ok. Thanks for this.  I spoke to her yesterday and she told me she was on the wait list as well.

## 2020-11-05 ENCOUNTER — Other Ambulatory Visit: Payer: Self-pay

## 2020-11-05 ENCOUNTER — Ambulatory Visit: Payer: BC Managed Care – PPO | Attending: Obstetrics and Gynecology

## 2020-11-05 DIAGNOSIS — O09523 Supervision of elderly multigravida, third trimester: Secondary | ICD-10-CM | POA: Insufficient documentation

## 2020-11-05 DIAGNOSIS — Z3A36 36 weeks gestation of pregnancy: Secondary | ICD-10-CM | POA: Insufficient documentation

## 2020-11-05 DIAGNOSIS — E039 Hypothyroidism, unspecified: Secondary | ICD-10-CM | POA: Insufficient documentation

## 2020-11-05 DIAGNOSIS — O99283 Endocrine, nutritional and metabolic diseases complicating pregnancy, third trimester: Secondary | ICD-10-CM | POA: Diagnosis not present

## 2020-11-06 ENCOUNTER — Other Ambulatory Visit (INDEPENDENT_AMBULATORY_CARE_PROVIDER_SITE_OTHER): Payer: BC Managed Care – PPO

## 2020-11-06 ENCOUNTER — Encounter: Payer: Self-pay | Admitting: Obstetrics and Gynecology

## 2020-11-06 ENCOUNTER — Ambulatory Visit (INDEPENDENT_AMBULATORY_CARE_PROVIDER_SITE_OTHER): Payer: BC Managed Care – PPO | Admitting: Obstetrics and Gynecology

## 2020-11-06 VITALS — BP 114/77 | HR 66 | Temp 66.0°F | Wt 181.8 lb

## 2020-11-06 DIAGNOSIS — Z3A37 37 weeks gestation of pregnancy: Secondary | ICD-10-CM

## 2020-11-06 DIAGNOSIS — Z3403 Encounter for supervision of normal first pregnancy, third trimester: Secondary | ICD-10-CM | POA: Diagnosis not present

## 2020-11-06 LAB — POCT URINALYSIS DIPSTICK OB
Bilirubin, UA: NEGATIVE
Blood, UA: NEGATIVE
Glucose, UA: NEGATIVE
Ketones, UA: NEGATIVE
Nitrite, UA: NEGATIVE
POC,PROTEIN,UA: NEGATIVE
Spec Grav, UA: 1.01 (ref 1.010–1.025)
Urobilinogen, UA: 0.2 E.U./dL
pH, UA: 6 (ref 5.0–8.0)

## 2020-11-06 NOTE — Progress Notes (Signed)
ROB: Patient is no complaints.  Denies contractions.  Positive group B strep discussed in detail.  NST today -reactive (required patient to have some juice).

## 2020-11-06 NOTE — Progress Notes (Signed)
ROB: Feels tired, but otherwise she is ok. No new concerns today.

## 2020-11-12 ENCOUNTER — Other Ambulatory Visit: Payer: BC Managed Care – PPO

## 2020-11-12 ENCOUNTER — Other Ambulatory Visit: Payer: Self-pay

## 2020-11-12 ENCOUNTER — Ambulatory Visit (INDEPENDENT_AMBULATORY_CARE_PROVIDER_SITE_OTHER): Payer: BC Managed Care – PPO | Admitting: Obstetrics and Gynecology

## 2020-11-12 ENCOUNTER — Encounter: Payer: Self-pay | Admitting: Obstetrics and Gynecology

## 2020-11-12 VITALS — BP 126/79 | HR 68 | Ht 65.0 in | Wt 183.8 lb

## 2020-11-12 DIAGNOSIS — E039 Hypothyroidism, unspecified: Secondary | ICD-10-CM

## 2020-11-12 DIAGNOSIS — O09523 Supervision of elderly multigravida, third trimester: Secondary | ICD-10-CM | POA: Diagnosis not present

## 2020-11-12 DIAGNOSIS — Z3A38 38 weeks gestation of pregnancy: Secondary | ICD-10-CM

## 2020-11-12 LAB — POCT URINALYSIS DIPSTICK OB
Bilirubin, UA: NEGATIVE
Blood, UA: NEGATIVE
Glucose, UA: NEGATIVE
Ketones, UA: NEGATIVE
Leukocytes, UA: NEGATIVE
Nitrite, UA: NEGATIVE
POC,PROTEIN,UA: NEGATIVE
Spec Grav, UA: 1.01 (ref 1.010–1.025)
Urobilinogen, UA: 0.2 E.U./dL
pH, UA: 6 (ref 5.0–8.0)

## 2020-11-12 NOTE — Progress Notes (Signed)
OB-Pt present for routine prenatal care and weekly NST. Pt c/o an increase in braxton hick contractions.

## 2020-11-12 NOTE — Patient Instructions (Signed)
Common Medications Safe in Pregnancy  Acne:      Constipation:  Benzoyl Peroxide     Colace  Clindamycin      Dulcolax Suppository  Topica Erythromycin     Fibercon  Salicylic Acid      Metamucil         Miralax AVOID:        Senakot   Accutane    Cough:  Retin-A       Cough Drops  Tetracycline      Phenergan w/ Codeine if Rx  Minocycline      Robitussin (Plain & DM)  Antibiotics:     Crabs/Lice:  Ceclor       RID  Cephalosporins    AVOID:  E-Mycins      Kwell  Keflex  Macrobid/Macrodantin   Diarrhea:  Penicillin      Kao-Pectate  Zithromax      Imodium AD         PUSH FLUIDS AVOID:       Cipro     Fever:  Tetracycline      Tylenol (Regular or Extra  Minocycline       Strength)  Levaquin      Extra Strength-Do not          Exceed 8 tabs/24 hrs Caffeine:        <200mg/day (equiv. To 1 cup of coffee or  approx. 3 12 oz sodas)         Gas: Cold/Hayfever:       Gas-X  Benadryl      Mylicon  Claritin       Phazyme  **Claritin-D        Chlor-Trimeton    Headaches:  Dimetapp      ASA-Free Excedrin  Drixoral-Non-Drowsy     Cold Compress  Mucinex (Guaifenasin)     Tylenol (Regular or Extra  Sudafed/Sudafed-12 Hour     Strength)  **Sudafed PE Pseudoephedrine   Tylenol Cold & Sinus     Vicks Vapor Rub  Zyrtec  **AVOID if Problems With Blood Pressure         Heartburn: Avoid lying down for at least 1 hour after meals  Aciphex      Maalox     Rash:  Milk of Magnesia     Benadryl    Mylanta       1% Hydrocortisone Cream  Pepcid  Pepcid Complete   Sleep Aids:  Prevacid      Ambien   Prilosec       Benadryl  Rolaids       Chamomile Tea  Tums (Limit 4/day)     Unisom         Tylenol PM         Warm milk-add vanilla or  Hemorrhoids:       Sugar for taste  Anusol/Anusol H.C.  (RX: Analapram 2.5%)  Sugar Substitutes:  Hydrocortisone OTC     Ok in moderation  Preparation H      Tucks        Vaseline lotion applied to tissue with  wiping    Herpes:     Throat:  Acyclovir      Oragel  Famvir  Valtrex     Vaccines:         Flu Shot Leg Cramps:       *Gardasil  Benadryl      Hepatitis A         Hepatitis B Nasal Spray:         Pneumovax  Saline Nasal Spray     Polio Booster         Tetanus Nausea:       Tuberculosis test or PPD  Vitamin B6 25 mg TID   AVOID:    Dramamine      *Gardasil  Emetrol       Live Poliovirus  Ginger Root 250 mg QID    MMR (measles, mumps &  High Complex Carbs @ Bedtime    rebella)  Sea Bands-Accupressure    Varicella (Chickenpox)  Unisom 1/2 tab TID     *No known complications           If received before Pain:         Known pregnancy;   Darvocet       Resume series after  Lortab        Delivery  Percocet    Yeast:   Tramadol      Femstat  Tylenol 3      Gyne-lotrimin  Ultram       Monistat  Vicodin           MISC:         All Sunscreens           Hair Coloring/highlights          Insect Repellant's          (Including DEET)         Mystic Tans Signs and Symptoms of Labor Labor is the body's natural process of moving the baby and the placenta out of the uterus. The process of labor usually starts when the baby is full-term, between 77 and 40 weeks of pregnancy. Signs and symptoms that you are close to going into labor As your body prepares for labor and the birth of your baby, you may notice the following symptoms in the weeks and days before true labor starts:  Passing a small amount of thick, bloody mucus from your vagina. This is called normal bloody show or losing your mucus plug. This may happen more than a week before labor begins, or right before labor begins, as the opening of the cervix starts to widen (dilate). For some women, the entire mucus plug passes at once. For others, pieces of the mucus plug may gradually pass over several days.  Your baby moving (dropping) lower in your pelvis to get into position for birth (lightening). When this happens, you may feel more  pressure on your bladder and pelvic bone and less pressure on your ribs. This may make it easier to breathe. It may also cause you to need to urinate more often and have problems with bowel movements.  Having "practice contractions," also called Braxton Hicks contractions or false labor. These occur at irregular (unevenly spaced) intervals that are more than 10 minutes apart. False labor contractions are common after exercise or sexual activity. They will stop if you change position, rest, or drink fluids. These contractions are usually mild and do not get stronger over time. They may feel like: ? A backache or back pain. ? Mild cramps, similar to menstrual cramps. ? Tightening or pressure in your abdomen. Other early symptoms include:  Nausea or loss of appetite.  Diarrhea.  Having a sudden burst of energy, or feeling very tired.  Mood changes.  Having trouble sleeping.   Signs and symptoms that labor has begun Signs that you are in labor may include:  Having contractions that come at regular (evenly spaced) intervals and  increase in intensity. This may feel like more intense tightening or pressure in your abdomen that moves to your back. ? Contractions may also feel like rhythmic pain in your upper thighs or back that comes and goes at regular intervals. ? For first-time mothers, this change in intensity of contractions often occurs at a more gradual pace. ? Women who have given birth before may notice a more rapid progression of contraction changes.  Feeling pressure in the vaginal area.  Your water breaking (rupture of membranes). This is when the sac of fluid that surrounds your baby breaks. Fluid leaking from your vagina may be clear or blood-tinged. Labor usually starts within 24 hours of your water breaking, but it may take longer to begin. ? Some women may feel a sudden gush of fluid. ? Others notice that their underwear repeatedly becomes damp. Follow these instructions at  home:  When labor starts, or if your water breaks, call your health care provider or nurse care line. Based on your situation, they will determine when you should go in for an exam.  During early labor, you may be able to rest and manage symptoms at home. Some strategies to try at home include: ? Breathing and relaxation techniques. ? Taking a warm bath or shower. ? Listening to music. ? Using a heating pad on the lower back for pain. If you are directed to use heat:  Place a towel between your skin and the heat source.  Leave the heat on for 20-30 minutes.  Remove the heat if your skin turns bright red. This is especially important if you are unable to feel pain, heat, or cold. You may have a greater risk of getting burned.   Contact a health care provider if:  Your labor has started.  Your water breaks. Get help right away if:  You have painful, regular contractions that are 5 minutes apart or less.  Labor starts before you are [redacted] weeks along in your pregnancy.  You have a fever.  You have bright red blood coming from your vagina.  You do not feel your baby moving.  You have a severe headache with or without vision problems.  You have severe nausea, vomiting, or diarrhea.  You have chest pain or shortness of breath. These symptoms may represent a serious problem that is an emergency. Do not wait to see if the symptoms will go away. Get medical help right away. Call your local emergency services (911 in the [place] Do not drive yourself to the hospital. Summary  Labor is your body's natural process of moving your baby and the placenta out of your uterus.  The process of labor usually starts when your baby is full-term, between 62 and 40 weeks of pregnancy.  When labor starts, or if your water breaks, call your health care provider or nurse care line. Based on your situation, they will determine when you should go in for an exam. This information is not intended to replace  advice given to you by your health care provider. Make sure you discuss any questions you have with your health care provider. Document Revised: 04/27/2020 Document Reviewed: 04/27/2020 Elsevier Patient Education  2021 [employer].

## 2020-11-12 NOTE — Progress Notes (Signed)
ROB: Patient notes an increase in [facility] ctx.  Otherwise doing well. Revisited discusssion of recommendations for delivery by 40 weeks due to AMA status with IVF pregnancy.  Patient ok for IOL at due date if labor does not ensue. Discussed different methods of IOL at patient's request.   Patient also reports discrepancy in due date, notes that per MFM and her REI, her due date is 5/13, not 5/8, but did not know if this made a difference. Adjusted due date according to REI conception date. Growth scan performed last week normal, with growth 48%ile, normal AFI. NST performed today was reviewed and was found to be reactive. Continue recommended antenatal testing and prenatal care.   NONSTRESS TEST INTERPRETATION  INDICATIONS:  AMA, IVF pregnancy  FHR baseline: 140 bpm RESULTS:Reactive COMMENTS: Uterine irritability with occasional contractions, not detected by patient.    PLAN: 1. Continue fetal kick counts twice a day. 2. Continue antepartum testing as scheduled-weekly

## 2020-11-19 ENCOUNTER — Other Ambulatory Visit: Payer: Self-pay

## 2020-11-19 ENCOUNTER — Encounter: Payer: Self-pay | Admitting: Obstetrics and Gynecology

## 2020-11-19 ENCOUNTER — Other Ambulatory Visit: Payer: BC Managed Care – PPO

## 2020-11-19 ENCOUNTER — Ambulatory Visit (INDEPENDENT_AMBULATORY_CARE_PROVIDER_SITE_OTHER): Payer: BC Managed Care – PPO | Admitting: Obstetrics and Gynecology

## 2020-11-19 VITALS — BP 140/81 | HR 79 | Wt 185.0 lb

## 2020-11-19 DIAGNOSIS — O09523 Supervision of elderly multigravida, third trimester: Secondary | ICD-10-CM

## 2020-11-19 DIAGNOSIS — Z3A38 38 weeks gestation of pregnancy: Secondary | ICD-10-CM

## 2020-11-19 LAB — POCT URINALYSIS DIPSTICK OB
Bilirubin, UA: NEGATIVE
Blood, UA: NEGATIVE
Glucose, UA: NEGATIVE
Ketones, UA: NEGATIVE
Leukocytes, UA: NEGATIVE
Nitrite, UA: NEGATIVE
POC,PROTEIN,UA: NEGATIVE
Spec Grav, UA: 1.01 (ref 1.010–1.025)
Urobilinogen, UA: 0.2 E.U./dL
pH, UA: 6 (ref 5.0–8.0)

## 2020-11-19 NOTE — Progress Notes (Signed)
ROB: Occasional Braxton Hicks.  NST reactive.  Induction again discussed.  Induction scheduled for 5/12.  COVID testing 5/10.  NST 5/10 with next visit.

## 2020-11-23 ENCOUNTER — Other Ambulatory Visit: Payer: Self-pay

## 2020-11-23 ENCOUNTER — Inpatient Hospital Stay
Admission: EM | Admit: 2020-11-23 | Discharge: 2020-11-25 | DRG: 807 | Disposition: A | Discharge status: Home / Self Care | Payer: BC Managed Care – PPO | Attending: Obstetrics and Gynecology | Admitting: Obstetrics and Gynecology

## 2020-11-23 ENCOUNTER — Encounter: Payer: Self-pay | Admitting: Obstetrics and Gynecology

## 2020-11-23 DIAGNOSIS — Z3A39 39 weeks gestation of pregnancy: Secondary | ICD-10-CM

## 2020-11-23 DIAGNOSIS — O99284 Endocrine, nutritional and metabolic diseases complicating childbirth: Secondary | ICD-10-CM | POA: Diagnosis present

## 2020-11-23 DIAGNOSIS — E039 Hypothyroidism, unspecified: Secondary | ICD-10-CM | POA: Diagnosis present

## 2020-11-23 DIAGNOSIS — O99824 Streptococcus B carrier state complicating childbirth: Principal | ICD-10-CM | POA: Diagnosis present

## 2020-11-23 DIAGNOSIS — Z20822 Contact with and (suspected) exposure to covid-19: Secondary | ICD-10-CM | POA: Diagnosis present

## 2020-11-23 DIAGNOSIS — O26893 Other specified pregnancy related conditions, third trimester: Secondary | ICD-10-CM | POA: Diagnosis present

## 2020-11-23 HISTORY — DX: Hypothyroidism, unspecified: E03.9

## 2020-11-23 LAB — CBC
HCT: 31.2 % — ABNORMAL LOW (ref 36.0–46.0)
Hemoglobin: 10.5 g/dL — ABNORMAL LOW (ref 12.0–15.0)
MCH: 30.9 pg (ref 26.0–34.0)
MCHC: 33.7 g/dL (ref 30.0–36.0)
MCV: 91.8 fL (ref 80.0–100.0)
Platelets: 212 10*3/uL (ref 150–400)
RBC: 3.4 MIL/uL — ABNORMAL LOW (ref 3.87–5.11)
RDW: 14.6 % (ref 11.5–15.5)
WBC: 14 10*3/uL — ABNORMAL HIGH (ref 4.0–10.5)
nRBC: 0 % (ref 0.0–0.2)

## 2020-11-23 LAB — TYPE AND SCREEN
ABO/RH(D): A POS
Antibody Screen: NEGATIVE

## 2020-11-23 LAB — RESP PANEL BY RT-PCR (FLU A&B, COVID) ARPGX2
Influenza A by PCR: NEGATIVE
Influenza B by PCR: NEGATIVE
SARS Coronavirus 2 by RT PCR: NEGATIVE

## 2020-11-23 MED ORDER — DIPHENHYDRAMINE HCL 25 MG PO CAPS: 25.0000 mg | ORAL_CAPSULE | Freq: Four times a day (QID) | ORAL | Status: DC | PRN

## 2020-11-23 MED ORDER — OXYCODONE-ACETAMINOPHEN 5-325 MG PO TABS: 1.0000 | ORAL_TABLET | ORAL | Status: DC | PRN

## 2020-11-23 MED ORDER — ACETAMINOPHEN 325 MG PO TABS: 650.0000 mg | ORAL_TABLET | ORAL | Status: DC | PRN

## 2020-11-23 MED ORDER — OXYTOCIN-SODIUM CHLORIDE 30-0.9 UT/500ML-% IV SOLN
INTRAVENOUS | Status: AC
  Administered 2020-11-23: 333 mL via INTRAVENOUS
  Filled 2020-11-23: qty 500

## 2020-11-23 MED ORDER — OXYTOCIN 10 UNIT/ML IJ SOLN
INTRAMUSCULAR | Status: AC
  Filled 2020-11-23: qty 2

## 2020-11-23 MED ORDER — IBUPROFEN 600 MG PO TABS
600.0000 mg | ORAL_TABLET | Freq: Four times a day (QID) | ORAL | Status: DC
  Administered 2020-11-23 – 2020-11-25 (×7): 600 mg via ORAL
  Filled 2020-11-23 (×7): qty 1

## 2020-11-23 MED ORDER — AMMONIA AROMATIC IN INHA
RESPIRATORY_TRACT | Status: AC
  Filled 2020-11-23: qty 10

## 2020-11-23 MED ORDER — ZOLPIDEM TARTRATE 5 MG PO TABS: 5.0000 mg | ORAL_TABLET | Freq: Every evening | ORAL | Status: DC | PRN

## 2020-11-23 MED ORDER — LIDOCAINE HCL (PF) 1 % IJ SOLN: 30.0000 mL | INTRAMUSCULAR | Status: DC | PRN

## 2020-11-23 MED ORDER — SOD CITRATE-CITRIC ACID 500-334 MG/5ML PO SOLN: 30.0000 mL | ORAL | Status: DC | PRN

## 2020-11-23 MED ORDER — LACTATED RINGERS IV SOLN: INTRAVENOUS | Status: DC

## 2020-11-23 MED ORDER — LIDOCAINE HCL (PF) 1 % IJ SOLN
INTRAMUSCULAR | Status: AC
  Filled 2020-11-23: qty 30

## 2020-11-23 MED ORDER — OXYTOCIN BOLUS FROM INFUSION: 333.0000 mL | Freq: Once | INTRAVENOUS | Status: AC

## 2020-11-23 MED ORDER — OXYCODONE-ACETAMINOPHEN 5-325 MG PO TABS: 2.0000 | ORAL_TABLET | ORAL | Status: DC | PRN

## 2020-11-23 MED ORDER — BENZOCAINE-MENTHOL 20-0.5 % EX AERO: 1.0000 "application " | INHALATION_SPRAY | CUTANEOUS | Status: DC | PRN

## 2020-11-23 MED ORDER — SODIUM CHLORIDE 0.9 % IV SOLN
INTRAVENOUS | Status: AC
  Administered 2020-11-23: 2 g via INTRAVENOUS
  Filled 2020-11-23: qty 2000

## 2020-11-23 MED ORDER — SODIUM CHLORIDE 0.9 % IV SOLN: 1.0000 g | INTRAVENOUS | Status: DC

## 2020-11-23 MED ORDER — OXYTOCIN-SODIUM CHLORIDE 30-0.9 UT/500ML-% IV SOLN
2.5000 [IU]/h | INTRAVENOUS | Status: DC
  Administered 2020-11-23: 2.5 [IU]/h via INTRAVENOUS

## 2020-11-23 MED ORDER — BUTORPHANOL TARTRATE 1 MG/ML IJ SOLN: 1.0000 mg | INTRAMUSCULAR | Status: DC | PRN

## 2020-11-23 MED ORDER — ONDANSETRON HCL 4 MG/2ML IJ SOLN: 4.0000 mg | Freq: Four times a day (QID) | INTRAMUSCULAR | Status: DC | PRN

## 2020-11-23 MED ORDER — SODIUM CHLORIDE 0.9 % IV SOLN: 2.0000 g | Freq: Once | INTRAVENOUS | Status: AC

## 2020-11-23 MED ORDER — ACETAMINOPHEN 325 MG PO TABS
650.0000 mg | ORAL_TABLET | ORAL | Status: DC | PRN
Start: 2020-11-23 — End: 2020-11-25

## 2020-11-23 MED ORDER — DOCUSATE SODIUM 100 MG PO CAPS
100.0000 mg | ORAL_CAPSULE | Freq: Two times a day (BID) | ORAL | Status: DC
  Administered 2020-11-23 – 2020-11-25 (×4): 100 mg via ORAL
  Filled 2020-11-23 (×4): qty 1

## 2020-11-23 MED ORDER — MISOPROSTOL 200 MCG PO TABS
ORAL_TABLET | ORAL | Status: AC
  Filled 2020-11-23: qty 4

## 2020-11-23 MED ORDER — TETANUS-DIPHTH-ACELL PERTUSSIS 5-2.5-18.5 LF-MCG/0.5 IM SUSY
0.5000 mL | PREFILLED_SYRINGE | Freq: Once | INTRAMUSCULAR | Status: DC
  Filled 2020-11-23: qty 0.5

## 2020-11-23 MED ORDER — SIMETHICONE 80 MG PO CHEW: 80.0000 mg | CHEWABLE_TABLET | ORAL | Status: DC | PRN

## 2020-11-23 MED ORDER — PRENATAL MULTIVITAMIN CH
1.0000 | ORAL_TABLET | Freq: Every day | ORAL | Status: DC
  Administered 2020-11-23 – 2020-11-25 (×3): 1 via ORAL
  Filled 2020-11-23 (×3): qty 1

## 2020-11-23 MED ORDER — OXYTOCIN-SODIUM CHLORIDE 30-0.9 UT/500ML-% IV SOLN: 2.5000 [IU]/h | INTRAVENOUS | Status: DC | PRN

## 2020-11-23 MED ORDER — LACTATED RINGERS IV SOLN: 500.0000 mL | INTRAVENOUS | Status: DC | PRN

## 2020-11-23 NOTE — H&P (Signed)
History and Physical   HPI  Tracy Gillespie is a 50 y.o. [id number] at [redacted]w[redacted]d Estimated Date of Delivery: 11/29/20 who is being admitted for labor management. Pregnancy - advanced reproductive endocrinology - donor egg.  OB History  OB History  Gravida Para Term Preterm AB Living  6 1 1  0 4 1  SAB IAB Ectopic Multiple Live Births  1 0 0 1 1    # Outcome Date GA Lbr Len/2nd Weight Sex Delivery Anes PTL Lv  6A Gravida           6B Current           5 SAB 11/2018 [redacted]w[redacted]d         4 AB 09/2016          3 AB 08/2014          2 Term 11/14/12 [redacted]w[redacted]d   M Vag-Spont  N LIV  1 AB             Obstetric Comments  G2 - 2014  no problems during labor, retained placenta postpartum (had D&C 10 weeks postpartum).  Did have hypothyroidism.   G4 - 09/2016 has miscarriage of twins (6 weeks, but not diagnosed until 11 weeks).      PROBLEM LIST  Pregnancy complications or risks: Patient Active Problem List   Diagnosis Date Noted  . Normal labor 11/23/2020  . History of abnormal mammogram 09/10/2020  . Maternal hypotension syndrome in third trimester 09/10/2020  . Supervision of elderly multigravida (>=82 years old at time of delivery), third trimester 09/10/2020  . Newborn product of in vitro fertilization (IVF) pregnancy 09/10/2020  . Acquired hypothyroidism 09/10/2020    Prenatal labs and studies: ABO, Rh: A/Positive/-- (10/11 1015) Antibody: Negative (10/11 1015) Rubella: 15.50 (10/11 1015) RPR: Non Reactive (02/22 1449)  HBsAg: Negative (10/11 1015)  HIV: Non Reactive (10/11 1015)  02-11-1980-- (04/12 1537)   Past Medical History:  Diagnosis Date  . Hypothyroidism   . Miscarriage   . Seasonal allergies   . Thyroid disease      Past Surgical History:  Procedure Laterality Date  . DILATION AND EVACUATION N/A 12/09/2016   Procedure: DILATATION AND EVACUATION;  Surgeon: 12/11/2016, MD;  Location: [facility] ORS;  Service: Gynecology;  Laterality: N/A;  . TONSILLECTOMY        Medications    Current Discharge Medication List    CONTINUE these medications which have NOT CHANGED   Details  ASPIRIN 81 PO Take by mouth.    azelastine (ASTELIN) 0.1 % nasal spray Place into both nostrils 2 (two) times daily. Use in each nostril as directed    fluticasone (FLONASE) 50 MCG/ACT nasal spray Place into both nostrils daily.    !! levothyroxine (SYNTHROID) 100 MCG tablet Take 100 mcg by mouth daily before breakfast. m t t f s Monday, Tuesday,thursday Friday saturday    !! levothyroxine (SYNTHROID) 150 MCG tablet Take 150 mcg by mouth daily before breakfast. Wednesday and sunday    Prenatal Vit-Fe Fumarate-FA (MULTIVITAMIN-PRENATAL) 27-0.8 MG TABS tablet Take 1 tablet by mouth daily at 12 noon.     !! - Potential duplicate medications found. Please discuss with provider.       Allergies  Patient has no known allergies.  Review of Systems  Pertinent items noted in HPI and remainder of comprehensive ROS otherwise negative.  Physical Exam  BP 121/63   Pulse 62   Temp 97.8 F (36.6 C) (Oral)   Resp Sunday)  22   Ht 5\' 5"  (1.651 m)   Wt 79.8 kg   LMP 02/18/2020 (Exact Date)   BMI 29.29 kg/m   Lungs:  CTA B Cardio: RRR without M/R/G Abd: Soft, gravid, NT Presentation: cephalic EXT: No C/C/ 1+ Edema DTRs: 2+ B CERVIX: Dilation: 10 Dilation Complete Date: 11/23/20 Dilation Complete Time: 0907 Effacement (%): 100 Station: 0,-1 Presentation: Vertex Exam by:: Donyea Beverlin  See Prenatal records for more detailed PE.     FHR:  Variability: Good {> 6 bpm)  Toco: Uterine Contractions: Regular  Test Results  Results for orders placed or performed during the hospital encounter of 11/23/20 (from the past 24 hour(s))  Resp Panel by RT-PCR (Flu A&B, Covid) Nasopharyngeal Swab     Status: None   Collection Time: 11/23/20  9:22 AM   Specimen: Nasopharyngeal Swab; Nasopharyngeal(NP) swabs in vial transport medium  Result Value Ref Range   SARS  Coronavirus 2 by RT PCR NEGATIVE NEGATIVE   Influenza A by PCR NEGATIVE NEGATIVE   Influenza B by PCR NEGATIVE NEGATIVE   Group B Strep positive  Assessment   01/23/21 at [redacted]w[redacted]d Estimated Date of Delivery: 11/29/20  The fetus is reassuring.   Patient Active Problem List   Diagnosis Date Noted  . Normal labor 11/23/2020  . History of abnormal mammogram 09/10/2020  . Maternal hypotension syndrome in third trimester 09/10/2020  . Supervision of elderly multigravida (>=66 years old at time of delivery), third trimester 09/10/2020  . Newborn product of in vitro fertilization (IVF) pregnancy 09/10/2020  . Acquired hypothyroidism 09/10/2020    Plan  1. Admit to L&D :   2. EFM: -- Category 1 3. Advanced cervical dialtion 4. Expect vaginal delivery 5. ABX for GBS   09/12/2020, M.D. 11/23/2020 10:21 AM

## 2020-11-24 ENCOUNTER — Inpatient Hospital Stay: Admit: 2020-11-24 | Payer: Self-pay

## 2020-11-24 LAB — RPR: RPR Ser Ql: NONREACTIVE

## 2020-11-24 NOTE — Progress Notes (Signed)
Progress Note - Vaginal Delivery  Garrie Elenes is a 50 y.o. [id number] now PP day 1 s/p Vaginal, Spontaneous .   Subjective:  The patient reports no complaints, up ad lib, voiding and tolerating PO   Objective:  Vital signs in last 24 hours: Temp:  [98.1 F (36.7 C)-98.6 F (37 C)] 98.4 F (36.9 C) (05/08 0733) Pulse Rate:  [51-58] 51 (05/08 0733) Resp:  [16-20] 18 (05/08 0733) BP: (91-127)/(55-83) 112/59 (05/08 0733) SpO2:  [98 %-100 %] 98 % (05/08 0733)  Physical Exam:  General: alert, cooperative and no distress Lochia: appropriate Uterine Fundus: firm    Data Review Recent Labs    11/23/20 0855  HGB 10.5*  HCT 31.2*    Assessment/Plan: Active Problems:   Normal labor   Plan for discharge tomorrow   -- Continue routine PP care.     Elonda Husky, M.D. 11/24/2020 10:19 AM

## 2020-11-25 NOTE — Progress Notes (Deleted)
OB-Pt present for routine prenatal care and NST.

## 2020-11-25 NOTE — Discharge Summary (Signed)
Patient Name: Tracy Gillespie DOB: April 11, 1971 MRN: [id number]                            Discharge Summary  Date of Admission: 11/23/2020 Date of Discharge: 11/25/2020 Delivering Provider: Linzie Collin   Admitting Diagnosis: Normal labor [O80, Z37.9] at [redacted]w[redacted]d Secondary diagnosis:  Active Problems:   Normal labor   Mode of Delivery: normal spontaneous vaginal delivery              Discharge diagnosis: Term Pregnancy Delivered      Intrapartum Procedures: Atificial rupture of membranes   Post partum procedures:   Complications: none                     Discharge Day SOAP Note:  Progress Note - Vaginal Delivery  Tracy Gillespie is a 50 y.o. [id number] now PP day 2 s/p Vaginal, Spontaneous . Delivery was uncomplicated  Subjective  The patient has the following complaints: has no unusual complaints  Pain is controlled with current medications.   Patient is urinating without difficulty.  She is ambulating well.     Objective  Vital signs: BP 126/73 (BP Location: Left Arm)   Pulse 62   Temp 98.6 F (37 C) (Oral)   Resp 20   Ht 5\' 5"  (1.651 m)   Wt 79.8 kg   LMP 02/18/2020 (Exact Date)   SpO2 100%   Breastfeeding Unknown   BMI 29.29 kg/m   Physical Exam: Gen: NAD Fundus Fundal Tone: Firm  Lochia Amount: Scant        Data Review Labs: Lab Results  Component Value Date   WBC 14.0 (H) 11/23/2020   HGB 10.5 (L) 11/23/2020   HCT 31.2 (L) 11/23/2020   MCV 91.8 11/23/2020   PLT 212 11/23/2020   CBC Latest Ref Rng & Units 11/23/2020 09/10/2020 05/15/2020  WBC 4.0 - 10.5 K/uL 14.0(H) 11.1(H) 9.1  Hemoglobin 12.0 - 15.0 g/dL 10.5(L) 11.2 12.8  Hematocrit 36.0 - 46.0 % 31.2(L) 32.7(L) 38.5  Platelets 150 - 400 K/uL 212 289 301   A POS  Edinburgh Score: Edinburgh Postnatal Depression Scale Screening Tool 11/23/2020  I have been able to laugh and see the funny side of things. 0  I have looked forward with enjoyment to things. 0  I have blamed myself  unnecessarily when things went wrong. 2  I have been anxious or worried for no good reason. 1  I have felt scared or panicky for no good reason. 1  Things have been getting on top of me. 1  I have been so unhappy that I have had difficulty sleeping. 0  I have felt sad or miserable. 0  I have been so unhappy that I have been crying. 0  The thought of harming myself has occurred to me. 0  Edinburgh Postnatal Depression Scale Total 5    Assessment/Plan  Active Problems:   Normal labor    Plan for discharge today.  Discharge Instructions: Per After Visit Summary. Activity: Advance as tolerated. Pelvic rest for 6 weeks.  Also refer to After Visit Summary Diet: Regular Medications: Allergies as of 11/25/2020   No Known Allergies     Medication List    STOP taking these medications   ASPIRIN 81 PO     TAKE these medications   azelastine 0.1 % nasal spray Commonly known as: ASTELIN Place into both nostrils 2 (two) times  daily. Use in each nostril as directed   fluticasone 50 MCG/ACT nasal spray Commonly known as: FLONASE Place into both nostrils daily.   levothyroxine 100 MCG tablet Commonly known as: SYNTHROID Take 100 mcg by mouth daily before breakfast. m t t f s Monday, Tuesday,thursday Friday saturday   levothyroxine 150 MCG tablet Commonly known as: SYNTHROID Take 150 mcg by mouth daily before breakfast. Wednesday and sunday   multivitamin-prenatal 27-0.8 MG Tabs tablet Take 1 tablet by mouth daily at 12 noon.      Outpatient follow up:   Follow-up Information    Linzie Collin, MD Follow up in 4 week(s).   Specialties: Obstetrics and Gynecology, Radiology Contact information: [address] [place] [address] [phone]              Postpartum contraception: Will discuss at first office visit post-partum  Discharged Condition: good  Discharged to: home  Newborn Data: Disposition:home with mother  Apgars: APGAR (1  MIN): 8   APGAR (5 MINS): 9   APGAR (10 MINS):    Baby Feeding: Bottle    Elonda Husky, M.D. 11/25/2020 10:27 AM

## 2020-11-26 ENCOUNTER — Other Ambulatory Visit: Payer: BC Managed Care – PPO

## 2020-11-26 ENCOUNTER — Encounter: Payer: BC Managed Care – PPO | Admitting: Obstetrics and Gynecology

## 2020-11-26 DIAGNOSIS — O09523 Supervision of elderly multigravida, third trimester: Secondary | ICD-10-CM

## 2020-11-26 DIAGNOSIS — Z3A39 39 weeks gestation of pregnancy: Secondary | ICD-10-CM

## 2020-12-23 ENCOUNTER — Ambulatory Visit (INDEPENDENT_AMBULATORY_CARE_PROVIDER_SITE_OTHER): Payer: BC Managed Care – PPO | Admitting: Obstetrics and Gynecology

## 2020-12-23 ENCOUNTER — Encounter: Payer: Self-pay | Admitting: Obstetrics and Gynecology

## 2020-12-23 ENCOUNTER — Other Ambulatory Visit: Payer: Self-pay

## 2020-12-23 NOTE — Progress Notes (Signed)
HPI:      [name] is a 50 y.o. [id number] who LMP was No LMP recorded (lmp unknown).  Subjective:   She presents today for her 4-week postpartum exam.  She has no complaints.  She has not resumed intercourse.  She states that she does not need birth control.  She is bottlefeeding at this time.    Hx: The following portions of the patient's history were reviewed and updated as appropriate:             She  has a past medical history of Hypothyroidism, Miscarriage, Seasonal allergies, and Thyroid disease. She does not have any pertinent problems on file. She  has a past surgical history that includes Tonsillectomy and Dilation and evacuation (N/A, 12/09/2016). Her family history is not on file. She  reports that she has never smoked. She has never used smokeless tobacco. She reports that she does not drink alcohol and does not use drugs. She has a current medication list which includes the following prescription(s): azelastine, fluticasone, levothyroxine, levothyroxine, and multivitamin-prenatal. She has No Known Allergies.       Review of Systems:  Review of Systems  Constitutional: Denied constitutional symptoms, night sweats, recent illness, fatigue, fever, insomnia and weight loss.  Eyes: Denied eye symptoms, eye pain, photophobia, vision change and visual disturbance.  Ears/Nose/Throat/Neck: Denied ear, nose, throat or neck symptoms, hearing loss, nasal discharge, sinus congestion and sore throat.  Cardiovascular: Denied cardiovascular symptoms, arrhythmia, chest pain/pressure, edema, exercise intolerance, orthopnea and palpitations.  Respiratory: Denied pulmonary symptoms, asthma, pleuritic pain, productive sputum, cough, dyspnea and wheezing.  Gastrointestinal: Denied, gastro-esophageal reflux, melena, nausea and vomiting.  Genitourinary:  Denied genitourinary symptoms including symptomatic vaginal discharge, pelvic relaxation issues, and urinary complaints.   Musculoskeletal: Denied musculoskeletal symptoms, stiffness, swelling, muscle weakness and myalgia.  Dermatologic: Denied dermatology symptoms, rash and scar.  Neurologic: Denied neurology symptoms, dizziness, headache, neck pain and syncope.  Psychiatric: Denied psychiatric symptoms, anxiety and depression.  Endocrine: Denied endocrine symptoms including hot flashes and night sweats.   Meds:   Current Outpatient Medications on File Prior to Visit  Medication Sig Dispense Refill  . azelastine (ASTELIN) 0.1 % nasal spray Place into both nostrils 2 (two) times daily. Use in each nostril as directed    . fluticasone (FLONASE) 50 MCG/ACT nasal spray Place into both nostrils daily.    Marland Kitchen levothyroxine (SYNTHROID) 100 MCG tablet Take 100 mcg by mouth daily before breakfast. m t t f s Monday, Tuesday,thursday Friday saturday    . levothyroxine (SYNTHROID) 150 MCG tablet Take 150 mcg by mouth daily before breakfast. Wednesday and sunday    . Prenatal Vit-Fe Fumarate-FA (MULTIVITAMIN-PRENATAL) 27-0.8 MG TABS tablet Take 1 tablet by mouth daily at 12 noon.     No current facility-administered medications on file prior to visit.       [facility] - 12/23/20 1113      Pregnancy Intention Screening   Does the patient want to become pregnant in the next year? No    Does the patient's partner want to become pregnant in the next year? No    Would the patient like to discuss contraceptive options today? No          The pregnancy intention screening data noted above was reviewed. Potential methods of contraception were discussed. The patient elected to proceed with No Method - Other Reason.     Objective:     Vitals:   12/23/20 1109  BP: 101/66  Pulse: 63  Filed Weights   12/23/20 1109  Weight: 155 lb 3.2 oz (70.4 kg)              Physical examination   Pelvic:   Vulva: Normal appearance.  No lesions.  Vagina: No lesions or abnormalities noted.  Support:  Second-degree cystocele  second-degree rectocele  Urethra No masses tenderness or scarring.  Meatus Normal size without lesions or prolapse.  Cervix: Normal appearance.  No lesions.  Anus: Normal exam.  No lesions.  Perineum: Normal exam.  No lesions.        Bimanual   Uterus: Normal size.  Non-tender.  Mobile.  AV.  Adnexae: No masses.  Non-tender to palpation.  Cul-de-sac: Negative for abnormality.     Assessment:    [id number] Patient Active Problem List   Diagnosis Date Noted  . Normal labor 11/23/2020  . History of abnormal mammogram 09/10/2020  . Maternal hypotension syndrome in third trimester 09/10/2020  . Supervision of elderly multigravida (>=42 years old at time of delivery), third trimester 09/10/2020  . Newborn product of in vitro fertilization (IVF) pregnancy 09/10/2020  . Acquired hypothyroidism 09/10/2020     1. Postpartum care and examination immediately after delivery     Patient doing well postpartum.   Plan:            1.  May resume normal activities.  2.  Follow-up in 3 months for annual exam Orders No orders of the defined types were placed in this encounter.   No orders of the defined types were placed in this encounter.     F/U  Return in about 3 months (around 03/25/2021) for Annual Physical.  Elonda Husky, M.D. 12/23/2020 11:32 AM

## 2021-01-16 ENCOUNTER — Encounter: Payer: BC Managed Care – PPO | Admitting: Obstetrics and Gynecology

## 2021-01-24 ENCOUNTER — Other Ambulatory Visit: Payer: Self-pay | Admitting: Obstetrics and Gynecology

## 2021-01-31 ENCOUNTER — Telehealth: Payer: Self-pay

## 2021-01-31 NOTE — Telephone Encounter (Signed)
Pt called no answer LM via VM to contact the office.  

## 2021-02-04 ENCOUNTER — Other Ambulatory Visit: Payer: Self-pay

## 2021-02-04 MED ORDER — METFORMIN HCL 500 MG PO TABS: 500.0000 mg | ORAL_TABLET | Freq: Two times a day (BID) | ORAL | 6 refills | Status: DC

## 2021-04-02 ENCOUNTER — Other Ambulatory Visit: Payer: Self-pay

## 2021-04-02 ENCOUNTER — Ambulatory Visit (INDEPENDENT_AMBULATORY_CARE_PROVIDER_SITE_OTHER): Payer: BC Managed Care – PPO | Admitting: Obstetrics and Gynecology

## 2021-04-02 ENCOUNTER — Encounter: Payer: Self-pay | Admitting: Obstetrics and Gynecology

## 2021-04-02 ENCOUNTER — Other Ambulatory Visit (HOSPITAL_COMMUNITY)
Admission: RE | Admit: 2021-04-02 | Discharge: 2021-04-02 | Disposition: A | Discharge status: Home / Self Care | Payer: BC Managed Care – PPO | Source: Ambulatory Visit | Attending: Obstetrics and Gynecology | Admitting: Obstetrics and Gynecology

## 2021-04-02 VITALS — BP 119/69 | HR 57 | Ht 65.0 in | Wt 144.8 lb

## 2021-04-02 DIAGNOSIS — Z87898 Personal history of other specified conditions: Secondary | ICD-10-CM

## 2021-04-02 DIAGNOSIS — E039 Hypothyroidism, unspecified: Secondary | ICD-10-CM

## 2021-04-02 DIAGNOSIS — Z124 Encounter for screening for malignant neoplasm of cervix: Secondary | ICD-10-CM | POA: Diagnosis present

## 2021-04-02 DIAGNOSIS — L659 Nonscarring hair loss, unspecified: Secondary | ICD-10-CM | POA: Diagnosis not present

## 2021-04-02 DIAGNOSIS — Z01419 Encounter for gynecological examination (general) (routine) without abnormal findings: Secondary | ICD-10-CM

## 2021-04-02 DIAGNOSIS — Z1231 Encounter for screening mammogram for malignant neoplasm of breast: Secondary | ICD-10-CM | POA: Diagnosis not present

## 2021-04-02 DIAGNOSIS — N912 Amenorrhea, unspecified: Secondary | ICD-10-CM

## 2021-04-02 NOTE — Patient Instructions (Addendum)
Breast Self-Awareness Breast self-awareness is knowing how your breasts look and feel. Doing breast self-awareness is important. It allows you to catch a breast problem early while it is still small and can be treated. All women should do breast self-awareness, including women who have had breast implants. Tell your doctor if you notice a change in your breasts. What you need: A mirror. A well-lit room. How to do a breast self-exam A breast self-exam is one way to learn what is normal for your breasts and to check for changes. To do a breast self-exam: Look for changes  Take off all the clothes above your waist. Stand in front of a mirror in a room with good lighting. Put your hands on your hips. Push your hands down. Look at your breasts and nipples in the mirror to see if one breast or nipple looks different from the other. Check to see if: The shape of one breast is different. The size of one breast is different. There are wrinkles, dips, and bumps in one breast and not the other. Look at each breast for changes in the skin, such as: Redness. Scaly areas. Look for changes in your nipples, such as: Liquid around the nipples. Bleeding. Dimpling. Redness. A change in where the nipples are. Feel for changes  Lie on your back on the floor. Feel each breast. To do this, follow these steps: Pick a breast to feel. Put the arm closest to that breast above your head. Use your other arm to feel the nipple area of your breast. Feel the area with the pads of your three middle fingers by making small circles with your fingers. For the first circle, press lightly. For the second circle, press harder. For the third circle, press even harder. Keep making circles with your fingers at the different pressures as you move down your breast. Stop when you feel your ribs. Move your fingers a little toward the center of your body. Start making circles with your fingers again, this time going up until  you reach your collarbone. Keep making up-and-down circles until you reach your armpit. Remember to keep using the three pressures. Feel the other breast in the same way. Sit or stand in the tub or shower. With soapy water on your skin, feel each breast the same way you did in step 2 when you were lying on the floor. Write down what you find Writing down what you find can help you remember what to tell your doctor. Write down: What is normal for each breast. Any changes you find in each breast, including: The kind of changes you find. Whether you have pain. Size and location of any lumps. When you last had your menstrual period. General tips Check your breasts every month. If you are breastfeeding, the best time to check your breasts is after you feed your baby or after you use a breast pump. If you get menstrual periods, the best time to check your breasts is 5-7 days after your menstrual period is over. With time, you will become comfortable with the self-exam, and you will begin to know if there are changes in your breasts. Contact a doctor if you: See a change in the shape or size of your breasts or nipples. See a change in the skin of your breast or nipples, such as red or scaly skin. Have fluid coming from your nipples that is not normal. Find a lump or thick area that was not there before. Have pain in   your breasts. Have any concerns about your breast health. Summary Breast self-awareness includes looking for changes in your breasts, as well as feeling for changes within your breasts. Breast self-awareness should be done in front of a mirror in a well-lit room. You should check your breasts every month. If you get menstrual periods, the best time to check your breasts is 5-7 days after your menstrual period is over. Let your doctor know of any changes you see in your breasts, including changes in size, changes on the skin, pain or tenderness, or fluid from your nipples that is not  normal. This information is not intended to replace advice given to you by your health care provider. Make sure you discuss any questions you have with your health care provider. Document Revised: 02/22/2018 Document Reviewed: 02/22/2018 Elsevier Patient Education  2022 [employer]    Preventive Care 40-64 Years Old, Female Preventive care refers to lifestyle choices and visits with your health care provider that can promote health and wellness. This includes: A yearly physical exam. This is also called an annual wellness visit. Regular dental and eye exams. Immunizations. Screening for certain conditions. Healthy lifestyle choices, such as: Eating a healthy diet. Getting regular exercise. Not using drugs or products that contain nicotine and tobacco. Limiting alcohol use. What can I expect for my preventive care visit? Physical exam Your health care provider will check your: Height and weight. These may be used to calculate your BMI (body mass index). BMI is a measurement that tells if you are at a healthy weight. Heart rate and blood pressure. Body temperature. Skin for abnormal spots. Counseling Your health care provider may ask you questions about your: Past medical problems. Family's medical history. Alcohol, tobacco, and drug use. Emotional well-being. Home life and relationship well-being. Sexual activity. Diet, exercise, and sleep habits. Work and work environment. Access to firearms. Method of birth control. Menstrual cycle. Pregnancy history. What immunizations do I need? Vaccines are usually given at various ages, according to a schedule. Your health care provider will recommend vaccines for you based on your age, medical history, and lifestyle or other factors, such as travel or where you work. What tests do I need? Blood tests Lipid and cholesterol levels. These may be checked every 5 years, or more often if you are over 50 years old. Hepatitis C  test. Hepatitis B test. Screening Lung cancer screening. You may have this screening every year starting at age 55 if you have a 30-pack-year history of smoking and currently smoke or have quit within the past 15 years. Colorectal cancer screening. All adults should have this screening starting at age 50 and continuing until age 75. Your health care provider may recommend screening at age 45 if you are at increased risk. You will have tests every 1-10 years, depending on your results and the type of screening test. Diabetes screening. This is done by checking your blood sugar (glucose) after you have not eaten for a while (fasting). You may have this done every 1-3 years. Mammogram. This may be done every 1-2 years. Talk with your health care provider about when you should start having regular mammograms. This may depend on whether you have a family history of breast cancer. BRCA-related cancer screening. This may be done if you have a family history of breast, ovarian, tubal, or peritoneal cancers. Pelvic exam and Pap test. This may be done every 3 years starting at age 21. Starting at age 30, this may be done every   5 years if you have a Pap test in combination with an HPV test. Other tests STD (sexually transmitted disease) testing, if you are at risk. Bone density scan. This is done to screen for osteoporosis. You may have this scan if you are at high risk for osteoporosis. Talk with your health care provider about your test results, treatment options, and if necessary, the need for more tests. Follow these instructions at home: Eating and drinking  Eat a diet that includes fresh fruits and vegetables, whole grains, lean protein, and low-fat dairy products. Take vitamin and mineral supplements as recommended by your health care provider. Do not drink alcohol if: Your health care provider tells you not to drink. You are pregnant, may be pregnant, or are planning to become pregnant. If  you drink alcohol: Limit how much you have to 0-1 drink a day. Be aware of how much alcohol is in your drink. In the [place] one drink equals one 12 oz bottle of beer (355 mL), one 5 oz glass of wine (148 mL), or one 1 oz glass of hard liquor (44 mL). Lifestyle Take daily care of your teeth and gums. Brush your teeth every morning and night with fluoride toothpaste. Floss one time each day. Stay active. Exercise for at least 30 minutes 5 or more days each week. Do not use any products that contain nicotine or tobacco, such as cigarettes, e-cigarettes, and chewing tobacco. If you need help quitting, ask your health care provider. Do not use drugs. If you are sexually active, practice safe sex. Use a condom or other form of protection to prevent STIs (sexually transmitted infections). If you do not wish to become pregnant, use a form of birth control. If you plan to become pregnant, see your health care provider for a prepregnancy visit. If told by your health care provider, take low-dose aspirin daily starting at age 50. Find healthy ways to cope with stress, such as: Meditation, yoga, or listening to music. Journaling. Talking to a trusted person. Spending time with friends and family. Safety Always wear your seat belt while driving or riding in a vehicle. Do not drive: If you have been drinking alcohol. Do not ride with someone who has been drinking. When you are tired or distracted. While texting. Wear a helmet and other protective equipment during sports activities. If you have firearms in your house, make sure you follow all gun safety procedures. What's next? Visit your health care provider once a year for an annual wellness visit. Ask your health care provider how often you should have your eyes and teeth checked. Stay up to date on all vaccines. This information is not intended to replace advice given to you by your health care provider. Make sure you discuss any questions you have  with your health care provider. Document Revised: 09/13/2020 Document Reviewed: 03/17/2018 Elsevier Patient Education  2022 Elsevier Inc.  

## 2021-04-02 NOTE — Progress Notes (Signed)
GYNECOLOGY ANNUAL EXAM CLINIC PROGRESS NOTE Subjective:     Tracy Gillespie is a 50 y.o. G18P1021 female with PMH of hypothyroidism here for annual exam. The patient is sexually active. The patient wears seatbelts: yes. The patient participates in regular exercise: yes. Has the patient ever been transfused or tattooed?: no.   The patient desires to note the following today:  1.  Patient complains of which she feels is an excessive amount of hair loss since the birth of her child approximately 4 months ago.  Is taking her prenatal vitamins still.  Is noting new nail growth but not as much hair growth. 2.  She reports that she has not had a menstrual cycle since June this year.  He had spotting in July but has had nothing since then.  Wonders that she may be transitioning to menopause.    Gynecologic History No LMP recorded (lmp unknown).  Contraception: none.  Has a history of infertility requiring IVF.  Last Pap: 01/07/2018. Results were: normal Last mammogram: 03/08/2020. Results were: abnormal - BIRADS 0.  Possible asymmetry with associated microcalcifications in the right breast.  Followed by Diagnostic Mammo of right breast - 5 mm group of indeterminate right breast calcifications. Recommendation is for stereotactic biopsy. Patient declined biopsy in January due to pregnancy.      Obstetric History OB History  Gravida Para Term Preterm AB Living  6 2 2   4 2   SAB IAB Ectopic Multiple Live Births  1     0 2    # Outcome Date GA Lbr Len/2nd Weight Sex Delivery Anes PTL Lv  6 Term 11/23/20 [redacted]w[redacted]d 06:17 / 00:41 7 lb 5.1 oz (3.32 kg) F Vag-Spont None  LIV  5 SAB 11/2018 [redacted]w[redacted]d         4 AB 09/2016          3 AB 08/2014          2 Term 11/14/12 [redacted]w[redacted]d   M Vag-Spont  N LIV  1 AB             Obstetric Comments  G2 - 2014  no problems during labor, retained placenta postpartum (had D&C 10 weeks postpartum).  Did have hypothyroidism.   G4 - 09/2016 has miscarriage of twins (6 weeks, but  not diagnosed until 11 weeks).       Past Medical History:  Diagnosis Date   Hypothyroidism    Miscarriage    Seasonal allergies    Thyroid disease     Family History  Problem Relation Age of Onset   Breast cancer Neg Hx    Ovarian cancer Neg Hx    Stroke Neg Hx    Osteoarthritis Neg Hx     Past Surgical History:  Procedure Laterality Date   DILATION AND EVACUATION N/A 12/09/2016   Procedure: DILATATION AND EVACUATION;  Surgeon: 12/11/2016, MD;  Location: [facility] ORS;  Service: Gynecology;  Laterality: N/A;   TONSILLECTOMY      Social History   Socioeconomic History   Marital status: Significant Other    Spouse name: Not on file   Number of children: Not on file   Years of education: Not on file   Highest education level: Not on file  Occupational History   Occupation: Retired  Tobacco Use   Smoking status: Never   Smokeless tobacco: Never  Vaping Use   Vaping Use: Never used  Substance and Sexual Activity   Alcohol use: No   Drug use:  No   Sexual activity: Not Currently    Birth control/protection: None  Other Topics Concern   Not on file  Social History Narrative   Not on file   Social Determinants of Health   Financial Resource Strain: Not on file  Food Insecurity: Not on file  Transportation Needs: Not on file  Physical Activity: Not on file  Stress: Not on file  Social Connections: Not on file  Intimate Partner Violence: Not on file    Current Outpatient Medications on File Prior to Visit  Medication Sig Dispense Refill   azelastine (ASTELIN) 0.1 % nasal spray Place into both nostrils 2 (two) times daily. Use in each nostril as directed     fluticasone (FLONASE) 50 MCG/ACT nasal spray Place into both nostrils daily.     levothyroxine (SYNTHROID) 88 MCG tablet Take 88 mcg by mouth daily before breakfast.     metFORMIN (GLUCOPHAGE) 500 MG tablet Take 1 tablet (500 mg total) by mouth 2 (two) times daily with a meal. 60 tablet 6   Prenatal  Vit-Fe Fumarate-FA (MULTIVITAMIN-PRENATAL) 27-0.8 MG TABS tablet Take 1 tablet by mouth daily at 12 noon.     No current facility-administered medications on file prior to visit.    No Known Allergies   Review of Systems Constitutional: negative for chills, fatigue, fevers and sweats Eyes: negative for irritation, redness and visual disturbance Ears, nose, mouth, throat, and face: negative for hearing loss, nasal congestion, snoring and tinnitus Respiratory: negative for asthma, cough, sputum Cardiovascular: negative for chest pain, dyspnea, exertional chest pressure/discomfort, irregular heart beat, palpitations and syncope Gastrointestinal: negative for abdominal pain, change in bowel habits, nausea and vomiting Genitourinary: positive for absent menstrual periods (see HPI).  Negative for genital lesions, sexual problems and vaginal discharge, dysuria and urinary incontinence.  Integument/breast: negative for breast lump, breast tenderness and nipple discharge Hematologic/lymphatic: negative for bleeding and easy bruising Musculoskeletal:negative for back pain and muscle weakness Neurological: negative for dizziness, headaches, vertigo and weakness Endocrine: negative for diabetic symptoms including polydipsia, polyuria and skin dryness. Positive for hair loss Allergic/Immunologic: negative for hay fever and urticaria     Objective:    BP 119/69 (BP Location: Left Arm, Patient Position: Sitting, Cuff Size: Normal)   Pulse (!) 57   Ht 5\' 5"  (1.651 m)   Wt 144 lb 12.8 oz (65.7 kg)   LMP  (LMP Unknown)   Breastfeeding No   BMI 24.10 kg/m   General Appearance:    Alert, cooperative, no distress, appears stated age  Head:    Normocephalic, without obvious abnormality, atraumatic  Eyes:    PERRL, conjunctiva/corneas clear, EOM's intact, both eyes  Ears:    External ear canals, both ears  Nose:   Nares normal, septum midline, mucosa normal, no drainage or sinus tenderness  Throat:    Lips, mucosa, and tongue normal; teeth and gums normal  Neck:   Supple, symmetrical, trachea midline, no adenopathy; thyroid:  no enlargement/tenderness/nodules; no carotid bruit or JVD  Back:     Symmetric, no curvature, ROM normal, no CVA tenderness  Lungs:     Clear to auscultation bilaterally, respirations unlabored  Chest Wall:    No tenderness or deformity   Heart:    Regular rate and rhythm, S1 and S2 normal, no murmur, rub or gallop  Breast Exam:    No tenderness, masses.   Abdomen:     Soft, non-tender, bowel sounds active all four quadrants, no masses, no organomegaly  Genitalia:  Normal female without lesion, discharge or tenderness  Rectal:    Normal tone, normal prostate, no masses or tenderness  Extremities:   Extremities normal, atraumatic, no cyanosis or edema,   Pulses:   2+ and symmetric all extremities  Skin:   Skin color, texture, turgor normal, no rashes or lesions  Lymph nodes:   Cervical, supraclavicular, and axillary nodes normal  Neurologic:   CNII-XII intact, normal strength, sensation and reflexes throughout     Labs:  Labs reviewed in Care Everywhere    Assessment:   Routine gynecologic exam Hypothyroidism H/o infertility Amenorrhea History of abnormal mammogram Hair loss  Plan:   - Labs: None ordered.  Had appt with PCP in August.  - Contraception: none. Has a history of infertility.  - Mammogram ordered. - Discussed healthy lifestyle interventions. - Pap smear performed today.  - History of amenorrhea since birth of last child, discussed that this still could be related to postpartum state, or could be related to her thyroid levels being abnormal due to current medication adjustment by endocrinologist.  Also based on patient's age cannot completely rule out entry into the perimenopausal state.  Advised that if she does not get her menstrual cycle after 6 months postpartum to return for further evaluation. - Hypothyroidism managed by  Endocrinologist.  Currently undergoing medication adjustment. - Hair loss could also be a symptom of her postpartum state or also for normal thyroid levels.  That in addition to her prenatal vitamin she could take a hair supplement that contains biotin.  Patient was given a list of several supplements.  Also advised on use of a volume Eyezin shampoo and to decrease pulling hair back into ponytails as much as possible. - Follow up in 1 year for annual exam.  Follow-up sooner if amenorrhea persists.   Hildred Laser, MD [facility]

## 2021-04-08 LAB — CYTOLOGY - PAP
Comment: NEGATIVE
Diagnosis: NEGATIVE
High risk HPV: NEGATIVE

## 2021-04-09 ENCOUNTER — Other Ambulatory Visit: Payer: Self-pay | Admitting: Obstetrics and Gynecology

## 2021-04-09 ENCOUNTER — Telehealth: Payer: BC Managed Care – PPO | Admitting: Obstetrics and Gynecology

## 2021-04-09 DIAGNOSIS — Z87898 Personal history of other specified conditions: Secondary | ICD-10-CM

## 2021-04-09 NOTE — Telephone Encounter (Signed)
Sanaia called in and states she tried to order her mammogram and was told by [facility]elf[facility] that the order was incorrect.  When the patient asked which one was supposed to be ordered they didn't tell her and just told her she had to call us and tell us to put the right order in.  Please advise.

## 2021-04-15 NOTE — Telephone Encounter (Signed)
Spoke to staff and Delf[facility] Field and was informed that patient had already scheduled her appointment for her mammogram.

## 2021-04-15 NOTE — Telephone Encounter (Signed)
Spoke to pt concerning her call to the office. Pt stated that she made the appointment for her mammogram the next day after calling the office.

## 2021-04-21 ENCOUNTER — Other Ambulatory Visit: Payer: Self-pay

## 2021-04-21 ENCOUNTER — Ambulatory Visit
Admission: RE | Admit: 2021-04-21 | Discharge: 2021-04-21 | Disposition: A | Discharge status: Home / Self Care | Payer: BC Managed Care – PPO | Source: Ambulatory Visit | Attending: Obstetrics and Gynecology | Admitting: Obstetrics and Gynecology

## 2021-04-21 DIAGNOSIS — Z87898 Personal history of other specified conditions: Secondary | ICD-10-CM

## 2021-09-02 ENCOUNTER — Encounter: Payer: Self-pay | Admitting: Obstetrics and Gynecology

## 2021-09-02 MED ORDER — METFORMIN HCL 500 MG PO TABS: 500.0000 mg | ORAL_TABLET | Freq: Two times a day (BID) | ORAL | 6 refills | Status: DC

## 2021-11-23 ENCOUNTER — Ambulatory Visit
Admission: EM | Admit: 2021-11-23 | Discharge: 2021-11-23 | Disposition: A | Discharge status: Home / Self Care | Payer: BC Managed Care – PPO | Attending: Emergency Medicine | Admitting: Emergency Medicine

## 2021-11-23 ENCOUNTER — Other Ambulatory Visit: Payer: Self-pay

## 2021-11-23 DIAGNOSIS — R21 Rash and other nonspecific skin eruption: Secondary | ICD-10-CM | POA: Diagnosis not present

## 2021-11-23 DIAGNOSIS — N938 Other specified abnormal uterine and vaginal bleeding: Secondary | ICD-10-CM | POA: Insufficient documentation

## 2021-11-23 MED ORDER — CEPHALEXIN 500 MG PO CAPS: 500.0000 mg | ORAL_CAPSULE | Freq: Two times a day (BID) | ORAL | 0 refills | Status: AC

## 2021-11-23 MED ORDER — PREDNISONE 20 MG PO TABS: 40.0000 mg | ORAL_TABLET | Freq: Every day | ORAL | 0 refills | Status: DC

## 2021-11-23 NOTE — ED Provider Notes (Signed)
?[facility] ? ? ? ?CSN: [id number] ?Arrival date & time: 11/23/21  0815 ? ? ?  ? ?History   ?Chief Complaint ?Chief Complaint  ?Patient presents with  ? Insect Bite  ?  On the back on right leg.   ? ? ?HPI ?Tracy Gillespie is a 51 y.o. female.  ? ? ?Patient presents with suspected insect bite to the back of the right leg for 1 month.  Initially thought an insect such as mosquito bit the back of her leg and there was a small papule present. Has increased in size Site is pruritic, nondraining.  Has attempted use of over-the-counter insect creams and anti-itch creams but have been ineffective.  Endorses that pruritus is worsening.  ? ?Past Medical History:  ?Diagnosis Date  ? Hypothyroidism   ? Miscarriage   ? Seasonal allergies   ? Thyroid disease   ? ? ?Patient Active Problem List  ? Diagnosis Date Noted  ? DUB (dysfunctional uterine bleeding) 11/23/2021  ? Normal labor 11/23/2020  ? History of abnormal mammogram 09/10/2020  ? Maternal hypotension syndrome in third trimester 09/10/2020  ? Supervision of elderly multigravida (>=7 years old at time of delivery), third trimester 09/10/2020  ? Newborn product of in vitro fertilization (IVF) pregnancy 09/10/2020  ? Health care maintenance 02/26/2016  ? Allergic rhinitis 06/13/2014  ? Hypothyroid 02/04/2014  ? PA (pernicious anemia) 02/04/2014  ? Migraine 02/04/2014  ? History of depression 02/04/2014  ? Pure hypercholesterolemia 10/18/2013  ? Low back pain 10/18/2013  ? Headache(784.0) 10/18/2013  ? ? ?Past Surgical History:  ?Procedure Laterality Date  ? DILATION AND EVACUATION N/A 12/09/2016  ? Procedure: DILATATION AND EVACUATION;  Surgeon: Benjaman Kindler, MD;  Location: [facility] ORS;  Service: Gynecology;  Laterality: N/A;  ? TONSILLECTOMY    ? ? ?OB History   ? ? Gravida  ?6  ? Para  ?2  ? Term  ?2  ? Preterm  ?   ? AB  ?4  ? Living  ?2  ?  ? ? SAB  ?1  ? IAB  ?   ? Ectopic  ?   ? Multiple  ?0  ? Live Births  ?2  ?   ?  ? Obstetric Comments  ?G2 - 2014  no  problems during labor, retained placenta postpartum (had D&C 10 weeks postpartum).  Did have hypothyroidism.  ?G4 - 09/2016 has miscarriage of twins (6 weeks, but not diagnosed until 11 weeks).    ?  ? ?  ? ? ? ?Home Medications   ? ?Prior to Admission medications   ?Medication Sig Start Date End Date Taking? Authorizing Provider  ?azelastine (ASTELIN) 0.1 % nasal spray Place into both nostrils 2 (two) times daily. Use in each nostril as directed   Yes [provider]  ?fluticasone (FLONASE) 50 MCG/ACT nasal spray Place into both nostrils daily.   Yes [provider]  ?levothyroxine (SYNTHROID) 88 MCG tablet Take 88 mcg by mouth daily before breakfast.   Yes [provider]  ?metFORMIN (GLUCOPHAGE) 500 MG tablet Take 1 tablet (500 mg total) by mouth 2 (two) times daily with a meal. 09/02/21  Yes Rubie Maid, MD  ?Prenatal Vit-Fe Fumarate-FA (MULTIVITAMIN-PRENATAL) 27-0.8 MG TABS tablet Take 1 tablet by mouth daily at 12 noon.   Yes [provider]  ? ? ?Family History ?Family History  ?Problem Relation Age of Onset  ? Breast cancer Neg Hx   ? Ovarian cancer Neg Hx   ? Stroke  Neg Hx   ? Osteoarthritis Neg Hx   ? ? ?Social History ?Social History  ? ?Tobacco Use  ? Smoking status: Never  ? Smokeless tobacco: Never  ?Vaping Use  ? Vaping Use: Never used  ?Substance Use Topics  ? Alcohol use: No  ? Drug use: No  ? ? ? ?Allergies   ?Patient has no known allergies. ? ? ?Review of Systems ?Review of Systems  ?Constitutional: Negative.   ?Respiratory: Negative.    ?Cardiovascular: Negative.   ?Musculoskeletal: Negative.   ?Skin:  Positive for wound. Negative for color change, pallor and rash.  ? ? ?Physical Exam ?Triage Vital Signs ?ED Triage Vitals [11/23/21 0820]  ?Enc Vitals Group  ?   BP   ?   Pulse   ?   Resp   ?   Temp   ?   Temp src   ?   SpO2   ?   Weight 130 lb (59 kg)  ?   Height 5\' 5"  (1.651 m)  ?   Head Circumference   ?   Peak Flow   ?   Pain Score 0  ?   Pain Loc   ?   Pain  Edu?   ?   Excl. in [place]?   ? ?No data found. ? ?Updated Vital Signs ?Ht 5\' 5"  (1.651 m)   Wt 130 lb (59 kg)   LMP 08/28/2021 (Approximate)   BMI 21.63 kg/m?  ? ?Visual Acuity ?Right Eye Distance:   ?Left Eye Distance:   ?Bilateral Distance:   ? ?Right Eye Near:   ?Left Eye Near:    ?Bilateral Near:    ? ?Physical Exam ?Constitutional:   ?   Appearance: Normal appearance.  ?HENT:  ?   Head: Normocephalic.  ?Pulmonary:  ?   Effort: Pulmonary effort is normal.  ?Skin: ?   Comments: Flesh tone whelp to the posterior right knee, nontender, nondraining  ?Neurological:  ?   Mental Status: She is alert and oriented to person, place, and time. Mental status is at baseline.  ?Psychiatric:     ?   Mood and Affect: Mood normal.     ?   Behavior: Behavior normal.  ? ? ? ?UC Treatments / Results  ?Labs ?(all labs ordered are listed, but only abnormal results are displayed) ?Labs Reviewed - No data to display ? ?EKG ? ? ?Radiology ?No results found. ? ?Procedures ?Procedures (including critical care time) ? ?Medications Ordered in UC ?Medications - No data to display ? ?Initial Impression / Assessment and Plan / UC Course  ?I have reviewed the triage vital signs and the nursing notes. ? ?Pertinent labs & imaging results that were available during my care of the patient were reviewed by me and considered in my medical decision making (see chart for details). ? ?Rash ? ?Unknown etiology of symptoms as patient denies changes in day-to-day routine and diet, will cover for bacteria and inflammatory response as site has been present for 1 month, Keflex and prednisone prescribed, may use topical antipruritic or oral antihistamines for management of itching, given strict precautions to follow-up with urgent care as needed if symptoms continue to persist ?Final Clinical Impressions(s) / UC Diagnoses  ? ?Final diagnoses:  ?None  ? ?Discharge Instructions   ?None ?  ? ?ED Prescriptions   ?None ?  ? ?PDMP not reviewed this encounter. ?   ?Hans Eden, NP ?11/23/21 [id number] ? ?

## 2021-11-23 NOTE — Discharge Instructions (Signed)
As we do not know the official cause of your rash today we will move forward with coverage for bacteria and inflammatory response ? ?Take cephalexin twice daily for 5 days ? ?Begin use of prednisone every morning with food for the next 5 days ? ?You may continue use of topical medications to help minimize itching, you may also take oral antihistamine such as Claritin or Zyrtec for additional comfort ? ?You may follow-up with urgent care if symptoms continue to persist or worsen ?

## 2021-11-23 NOTE — ED Triage Notes (Signed)
Patient c.o insect bite on the on back of her right leg. Patient states it itching and burning. Patient denies any fevers.  ?

## 2022-03-06 ENCOUNTER — Ambulatory Visit
Admission: EM | Admit: 2022-03-06 | Discharge: 2022-03-06 | Disposition: A | Discharge status: Home / Self Care | Payer: BC Managed Care – PPO | Attending: Physician Assistant | Admitting: Physician Assistant

## 2022-03-06 DIAGNOSIS — R509 Fever, unspecified: Secondary | ICD-10-CM | POA: Insufficient documentation

## 2022-03-06 DIAGNOSIS — R0981 Nasal congestion: Secondary | ICD-10-CM | POA: Insufficient documentation

## 2022-03-06 DIAGNOSIS — J029 Acute pharyngitis, unspecified: Secondary | ICD-10-CM | POA: Diagnosis present

## 2022-03-06 DIAGNOSIS — U071 COVID-19: Secondary | ICD-10-CM | POA: Diagnosis present

## 2022-03-06 LAB — RESP PANEL BY RT-PCR (FLU A&B, COVID) ARPGX2
Influenza A by PCR: NEGATIVE
Influenza B by PCR: NEGATIVE
SARS Coronavirus 2 by RT PCR: POSITIVE — AB

## 2022-03-06 LAB — GROUP A STREP BY PCR: Group A Strep by PCR: NOT DETECTED

## 2022-03-06 MED ORDER — PROMETHAZINE-DM 6.25-15 MG/5ML PO SYRP: 5.0000 mL | ORAL_SOLUTION | Freq: Four times a day (QID) | ORAL | 0 refills | Status: DC | PRN

## 2022-03-06 MED ORDER — MOLNUPIRAVIR 200 MG PO CAPS: 4.0000 | ORAL_CAPSULE | Freq: Two times a day (BID) | ORAL | 0 refills | Status: AC

## 2022-03-06 MED ORDER — LIDOCAINE VISCOUS HCL 2 % MT SOLN
15.0000 mL | OROMUCOSAL | 0 refills | Status: DC | PRN
Start: 2022-03-06 — End: 2023-05-04

## 2022-03-06 NOTE — ED Provider Notes (Signed)
[facility]    CSN: [id number] Arrival date & time: 03/06/22  1741      History   Chief Complaint Chief Complaint  Patient presents with   Nasal Congestion   Sore Throat    HPI Tracy Gillespie is a 51 y.o. female presenting for 2-day history of nasal congestion, postnasal drainage, sore throat and cough.  Patient also reports fatigue.  She states that she started to feel feverish today and developed a temp of 100.7.  Reports that her youngest son is sick with a virus.  He was tested for COVID, strep and flu and everything was negative.  She is concerned about the possibility of COVID, other viral illness, sinusitis and pneumonia.  Has not taken any over-the-counter medication for her symptoms.  History of thyroid disease, allergies.  Reports that she has had all of her COVID vaccines and also 2 pneumonia shots.  HPI  Past Medical History:  Diagnosis Date   Hypothyroidism    Miscarriage    Seasonal allergies    Thyroid disease     Patient Active Problem List   Diagnosis Date Noted   DUB (dysfunctional uterine bleeding) 11/23/2021   Normal labor 11/23/2020   History of abnormal mammogram 09/10/2020   Maternal hypotension syndrome in third trimester 09/10/2020   Supervision of elderly multigravida (>=69 years old at time of delivery), third trimester 09/10/2020   Newborn product of in vitro fertilization (IVF) pregnancy 09/10/2020   Health care maintenance 02/26/2016   Allergic rhinitis 06/13/2014   Hypothyroid 02/04/2014   PA (pernicious anemia) 02/04/2014   Migraine 02/04/2014   History of depression 02/04/2014   Pure hypercholesterolemia 10/18/2013   Low back pain 10/18/2013   Headache(784.0) 10/18/2013    Past Surgical History:  Procedure Laterality Date   DILATION AND EVACUATION N/A 12/09/2016   Procedure: DILATATION AND EVACUATION;  Surgeon: Christeen Douglas, MD;  Location: [facility] OR[facility];  Service: Gynecology;  Laterality: N/A;   TONSILLECTOMY       OB History     Gravida  6   Para  2   Term  2   Preterm      AB  4   Living  2      SAB  1   IAB      Ectopic      Multiple  0   Live Births  2        Obstetric Comments  G2 - 2014  no problems during labor, retained placenta postpartum (had D&C 10 weeks postpartum).  Did have hypothyroidism.  G4 - 09/2016 has miscarriage of twins (6 weeks, but not diagnosed until 11 weeks).            Home Medications    Prior to Admission medications   Medication Sig Start Date End Date Taking? Authorizing Provider  azelastine (ASTELIN) 0.1 % nasal spray Place into both nostrils 2 (two) times daily. Use in each nostril as directed   Yes [provider]  fluticasone (FLONASE) 50 MCG/ACT nasal spray Place into both nostrils daily.   Yes [provider]  levothyroxine (SYNTHROID) 88 MCG tablet Take 88 mcg by mouth daily before breakfast.   Yes [provider]  lidocaine (XYLOCAINE) 2 % solution Use as directed 15 mLs in the mouth or throat every 3 (three) hours as needed (swish and spit). 03/06/22  Yes Shirlee Latch, PA-C  metFORMIN (GLUCOPHAGE) 500 MG tablet Take 1 tablet (500 mg total) by mouth 2 (two) times daily  with a meal. 09/02/21  Yes Hildred Laser, MD  molnupiravir EUA (LAGEVRIO) 200 MG CAPS capsule Take 4 capsules (800 mg total) by mouth 2 (two) times daily for 5 days. 03/06/22 03/11/22 Yes Shirlee Latch, PA-C  Prenatal Vit-Fe Fumarate-FA (MULTIVITAMIN-PRENATAL) 27-0.8 MG TABS tablet Take 1 tablet by mouth daily at 12 noon.   Yes [provider]  promethazine-dextromethorphan (PROMETHAZINE-DM) 6.25-15 MG/5ML syrup Take 5 mLs by mouth 4 (four) times daily as needed. 03/06/22  Yes Shirlee Latch, PA-C    Family History Family History  Problem Relation Age of Onset   Breast cancer Neg Hx    Ovarian cancer Neg Hx    Stroke Neg Hx    Osteoarthritis Neg Hx     Social History Social History   Tobacco Use   Smoking status: Never    Smokeless tobacco: Never  Vaping Use   Vaping Use: Never used  Substance Use Topics   Alcohol use: No   Drug use: No     Allergies   Patient has no known allergies.   Review of Systems Review of Systems  Constitutional:  Positive for fatigue and fever. Negative for chills and diaphoresis.  HENT:  Positive for congestion, rhinorrhea and sore throat. Negative for ear pain, sinus pressure and sinus pain.   Respiratory:  Positive for cough. Negative for shortness of breath.   Gastrointestinal:  Negative for abdominal pain, nausea and vomiting.  Musculoskeletal:  Negative for arthralgias and myalgias.  Skin:  Negative for rash.  Neurological:  Negative for weakness and headaches.  Hematological:  Negative for adenopathy.     Physical Exam Triage Vital Signs ED Triage Vitals  Enc Vitals Group     BP 03/06/22 1754 119/76     Pulse Rate 03/06/22 1754 88     Resp 03/06/22 1754 18     Temp 03/06/22 1754 (!) 100.7 F (38.2 C)     Temp Source 03/06/22 1754 Oral     SpO2 03/06/22 1754 98 %     Weight 03/06/22 1752 135 lb (61.2 kg)     Height 03/06/22 1752 5\' 5"  (1.651 m)     Head Circumference --      Peak Flow --      Pain Score 03/06/22 1752 8     Pain Loc --      Pain Edu? --      Excl. in [place]? --    No data found.  Updated Vital Signs BP 119/76 (BP Location: Left Arm)   Pulse 88   Temp (!) 100.7 F (38.2 C) (Oral)   Resp 18   Ht 5\' 5"  (1.651 m)   Wt 135 lb (61.2 kg)   LMP 12/26/2021   SpO2 98%   BMI 22.47 kg/m      Physical Exam Vitals and nursing note reviewed.  Constitutional:      General: She is not in acute distress.    Appearance: Normal appearance. She is well-developed. She is ill-appearing. She is not toxic-appearing.  HENT:     Head: Normocephalic and atraumatic.     Right Ear: Tympanic membrane, ear canal and external ear normal.     Left Ear: Tympanic membrane, ear canal and external ear normal.     Nose: Congestion present.      Mouth/Throat:     Mouth: Mucous membranes are moist.     Pharynx: Oropharynx is clear. Posterior oropharyngeal erythema present.  Eyes:     General: No scleral icterus.  Right eye: No discharge.        Left eye: No discharge.     Conjunctiva/sclera: Conjunctivae normal.  Cardiovascular:     Rate and Rhythm: Normal rate and regular rhythm.     Heart sounds: Normal heart sounds.  Pulmonary:     Effort: Pulmonary effort is normal. No respiratory distress.     Breath sounds: Normal breath sounds. No wheezing, rhonchi or rales.  Musculoskeletal:     Cervical back: Neck supple.  Skin:    General: Skin is dry.  Neurological:     General: No focal deficit present.     Mental Status: She is alert. Mental status is at baseline.     Motor: No weakness.     Gait: Gait normal.  Psychiatric:        Mood and Affect: Mood normal.        Behavior: Behavior normal.        Thought Content: Thought content normal.      UC Treatments / Results  Labs (all labs ordered are listed, but only abnormal results are displayed) Labs Reviewed  RESP PANEL BY RT-PCR (FLU A&B, COVID) ARPGX2 - Abnormal; Notable for the following components:      Result Value   SARS Coronavirus 2 by RT PCR POSITIVE (*)    All other components within normal limits  GROUP A STREP BY PCR    EKG   Radiology No results found.  Procedures Procedures (including critical care time)  Medications Ordered in UC Medications - No data to display  Initial Impression / Assessment and Plan / UC Course  I have reviewed the triage vital signs and the nursing notes.  Pertinent labs & imaging results that were available during my care of the patient were reviewed by me and considered in my medical decision making (see chart for details).   51 year old female presenting for 2-day history of nasal congestion, postnasal drainage, sore throat and cough.  Onset of fever up to 100.7 degrees today.  Has been around her son who has  a virus.  Patient is mildly ill-appearing but nontoxic.  No respiratory distress.  Mild nasal congestion on exam as well as erythema posterior pharynx.  Chest clear to auscultation and heart regular rate and rhythm.  PCR strep test obtained.  Negative. PCR COVID is obtained.  Positive.  Discussed results with patient.  Reviewed current CDC guidelines, isolation protocol and ED precautions.  Sent molnupiravir to pharmacy as patient is a candidate.  Also sent Promethazine DM and viscous lidocaine.  Reviewed return precautions.  Final Clinical Impressions(s) / UC Diagnoses   Final diagnoses:  COVID-19  Nasal congestion  Sore throat  Fever, unspecified     Discharge Instructions      -Your COVID test is positive.  You need to isolate 5 days and wear a mask for 5 days. -Increase your rest and fluids.  I have sent antiviral medicine to the pharmacy.  Started as soon as possible. - I have also sent cough medication and viscous lidocaine. - Take Tylenol as needed for your fever. - You should be feeling better in about a week but if you have any uncontrolled fever, weakness, breathing difficulty, you should be seen again and reevaluated.     ED Prescriptions     Medication Sig Dispense Auth. Provider   promethazine-dextromethorphan (PROMETHAZINE-DM) 6.25-15 MG/5ML syrup Take 5 mLs by mouth 4 (four) times daily as needed. 118 mL Eusebio Friendly B, PA-C   lidocaine (XYLOCAINE)  2 % solution Use as directed 15 mLs in the mouth or throat every 3 (three) hours as needed (swish and spit). 100 mL Eusebio Friendly B, PA-C   molnupiravir EUA (LAGEVRIO) 200 MG CAPS capsule Take 4 capsules (800 mg total) by mouth 2 (two) times daily for 5 days. 40 capsule Shirlee Latch, PA-C      PDMP not reviewed this encounter.   Shirlee Latch, PA-C 03/06/22 1858

## 2022-03-06 NOTE — Discharge Instructions (Addendum)
-  Your COVID test is positive.  You need to isolate 5 days and wear a mask for 5 days. -Increase your rest and fluids.  I have sent antiviral medicine to the pharmacy.  Started as soon as possible. - I have also sent cough medication and viscous lidocaine. - Take Tylenol as needed for your fever. - You should be feeling better in about a week but if you have any uncontrolled fever, weakness, breathing difficulty, you should be seen again and reevaluated.

## 2022-03-06 NOTE — ED Triage Notes (Signed)
Pt c/o nasal drainage, sore throat and cough from nasal drainage.  Pt states that the last time she had a URI she had pneumonia and is concerned.   Pt was around son who was sick with a viral infection but was negative for covid and flu.

## 2022-04-07 NOTE — Progress Notes (Unsigned)
ANNUAL PREVENTATIVE CARE GYNECOLOGY  ENCOUNTER NOTE  Subjective:       Tracy Gillespie is a 51 y.o. [phone] female here for a routine annual gynecologic exam. The patient is sexually active. The patient {is/is not:13135} taking hormone replacement therapy. {post-men bleed:13152::"Patient denies post-menopausal vaginal bleeding."} The patient wears seatbelts: {yes/no:311178}. The patient participates in regular exercise: {yes/no/not asked:9010}. Has the patient ever been transfused or tattooed?: {yes/no/not asked:9010}. The patient reports that there {is/is not:9024} domestic violence in her life.  Current complaints: 1.  ***    Gynecologic History No LMP recorded. Contraception: none. none.  Has a history of infertility requiring IVF.  Last Pap: 04/02/2021. Results were: normal Last mammogram: 04/21/2021. Results were: Stable probable benign calcifications in the right breast. Last Colonoscopy: Never done Last Dexa Scan: Never done   Obstetric History OB History  Gravida Para Term Preterm AB Living  6 2 2   4 2   SAB IAB Ectopic Multiple Live Births  1     0 2    # Outcome Date GA Lbr Len/2nd Weight Sex Delivery Anes PTL Lv  6 Term 11/23/20 [redacted]w[redacted]d 06:17 / 00:41 7 lb 5.1 oz (3.32 kg) F Vag-Spont None  LIV  5 SAB 11/2018 [redacted]w[redacted]d         4 AB 09/2016          3 AB 08/2014          2 Term 11/14/12 [redacted]w[redacted]d   M Vag-Spont  N LIV  1 AB             Obstetric Comments  G2 - 2014  no problems during labor, retained placenta postpartum (had D&C 10 weeks postpartum).  Did have hypothyroidism.   G4 - 09/2016 has miscarriage of twins (6 weeks, but not diagnosed until 11 weeks).      Past Medical History:  Diagnosis Date   Hypothyroidism    Miscarriage    Seasonal allergies    Thyroid disease     Family History  Problem Relation Age of Onset   Breast cancer Neg Hx    Ovarian cancer Neg Hx    Stroke Neg Hx    Osteoarthritis Neg Hx     Past Surgical History:  Procedure Laterality  Date   DILATION AND EVACUATION N/A 12/09/2016   Procedure: DILATATION AND EVACUATION;  Surgeon: 12/11/2016, MD;  Location: [facility] ORS;  Service: Gynecology;  Laterality: N/A;   TONSILLECTOMY      Social History   Socioeconomic History   Marital status: Significant Other    Spouse name: Not on file   Number of children: Not on file   Years of education: Not on file   Highest education level: Not on file  Occupational History   Occupation: Retired  Tobacco Use   Smoking status: Never   Smokeless tobacco: Never  Vaping Use   Vaping Use: Never used  Substance and Sexual Activity   Alcohol use: No   Drug use: No   Sexual activity: Not Currently    Birth control/protection: None  Other Topics Concern   Not on file  Social History Narrative   Not on file   Social Determinants of Health   Financial Resource Strain: Not on file  Food Insecurity: Not on file  Transportation Needs: Not on file  Physical Activity: Not on file  Stress: Not on file  Social Connections: Not on file  Intimate Partner Violence: Not on file    Current Outpatient Medications on  File Prior to Visit  Medication Sig Dispense Refill   azelastine (ASTELIN) 0.1 % nasal spray Place into both nostrils 2 (two) times daily. Use in each nostril as directed     fluticasone (FLONASE) 50 MCG/ACT nasal spray Place into both nostrils daily.     levothyroxine (SYNTHROID) 88 MCG tablet Take 88 mcg by mouth daily before breakfast.     lidocaine (XYLOCAINE) 2 % solution Use as directed 15 mLs in the mouth or throat every 3 (three) hours as needed (swish and spit). 100 mL 0   metFORMIN (GLUCOPHAGE) 500 MG tablet Take 1 tablet (500 mg total) by mouth 2 (two) times daily with a meal. 60 tablet 6   Prenatal Vit-Fe Fumarate-FA (MULTIVITAMIN-PRENATAL) 27-0.8 MG TABS tablet Take 1 tablet by mouth daily at 12 noon.     promethazine-dextromethorphan (PROMETHAZINE-DM) 6.25-15 MG/5ML syrup Take 5 mLs by mouth 4 (four) times daily  as needed. 118 mL 0   No current facility-administered medications on file prior to visit.    No Known Allergies    Review of Systems ROS Review of Systems - General ROS: negative for - chills, fatigue, fever, hot flashes, night sweats, weight gain or weight loss Psychological ROS: negative for - anxiety, decreased libido, depression, mood swings, physical abuse or sexual abuse Ophthalmic ROS: negative for - blurry vision, eye pain or loss of vision ENT ROS: negative for - headaches, hearing change, visual changes or vocal changes Allergy and Immunology ROS: negative for - hives, itchy/watery eyes or seasonal allergies Hematological and Lymphatic ROS: negative for - bleeding problems, bruising, swollen lymph nodes or weight loss Endocrine ROS: negative for - galactorrhea, hair pattern changes, hot flashes, malaise/lethargy, mood swings, palpitations, polydipsia/polyuria, skin changes, temperature intolerance or unexpected weight changes Breast ROS: negative for - new or changing breast lumps or nipple discharge Respiratory ROS: negative for - cough or shortness of breath Cardiovascular ROS: negative for - chest pain, irregular heartbeat, palpitations or shortness of breath Gastrointestinal ROS: no abdominal pain, change in bowel habits, or black or bloody stools Genito-Urinary ROS: no dysuria, trouble voiding, or hematuria Musculoskeletal ROS: negative for - joint pain or joint stiffness Neurological ROS: negative for - bowel and bladder control changes Dermatological ROS: negative for rash and skin lesion changes   Objective:   There were no vitals taken for this visit. CONSTITUTIONAL: Well-developed, well-nourished female in no acute distress.  PSYCHIATRIC: Normal mood and affect. Normal behavior. Normal judgment and thought content. NEUROLGIC: Alert and oriented to person, place, and time. Normal muscle tone coordination. No cranial nerve deficit noted. HENT:  Normocephalic,  atraumatic, External right and left ear normal. Oropharynx is clear and moist EYES: Conjunctivae and EOM are normal. Pupils are equal, round, and reactive to light. No scleral icterus.  NECK: Normal range of motion, supple, no masses.  Normal thyroid.  SKIN: Skin is warm and dry. No rash noted. Not diaphoretic. No erythema. No pallor. CARDIOVASCULAR: Normal heart rate noted, regular rhythm, no murmur. RESPIRATORY: Clear to auscultation bilaterally. Effort and breath sounds normal, no problems with respiration noted. BREASTS: Symmetric in size. No masses, skin changes, nipple drainage, or lymphadenopathy. ABDOMEN: Soft, normal bowel sounds, no distention noted.  No tenderness, rebound or guarding.  BLADDER: Normal PELVIC:  Bladder {:311640}  Urethra: {:311719}  Vulva: {:311722}  Vagina: {:311643}  Cervix: {:311644}  Uterus: {:311718}  Adnexa: {:311645}  RV: {Blank multiple:19196::"External Exam NormaI","No Rectal Masses","Normal Sphincter tone"}  MUSCULOSKELETAL: Normal range of motion. No tenderness.  No cyanosis, clubbing,  or edema.  2+ distal pulses. LYMPHATIC: No Axillary, Supraclavicular, or Inguinal Adenopathy.   Labs: Lab Results  Component Value Date   WBC 14.0 (H) 11/23/2020   HGB 10.5 (L) 11/23/2020   HCT 31.2 (L) 11/23/2020   MCV 91.8 11/23/2020   PLT 212 11/23/2020    Lab Results  Component Value Date   CREATININE 0.71 03/04/2018   BUN 9 03/04/2018   NA 139 03/04/2018   K 4.4 03/04/2018   CL 101 03/04/2018   CO2 24 03/04/2018    Lab Results  Component Value Date   ALT 13 03/04/2018   AST 17 03/04/2018   ALKPHOS 49 03/04/2018   BILITOT 0.4 03/04/2018    Lab Results  Component Value Date   CHOL 252 (H) 03/04/2018   HDL 76 03/04/2018   LDLCALC 165 (H) 03/04/2018   TRIG 57 03/04/2018   CHOLHDL 3.3 03/04/2018    Lab Results  Component Value Date   TSH 4.627 (H) 03/07/2017    No results found for: "HGBA1C"   Assessment:   No diagnosis found.    Plan:  Pap:  UTD Mammogram: Ordered Colon Screening:   Never Done Labs: {Blank multiple:19196::"Lipid 1","FBS","TSH","Hemoglobin A1C","Vit D Level""***"} Routine preventative health maintenance measures emphasized: Exercise/Diet/Weight control, Tobacco Warnings, Alcohol/Substance use risks, Stress Management, Peer Pressure Issues, and Safe Sex COVID Vaccination status: Return to [place], MD [facility]

## 2022-04-08 ENCOUNTER — Encounter: Payer: Self-pay | Admitting: Obstetrics and Gynecology

## 2022-04-08 ENCOUNTER — Ambulatory Visit (INDEPENDENT_AMBULATORY_CARE_PROVIDER_SITE_OTHER): Payer: BC Managed Care – PPO | Admitting: Obstetrics and Gynecology

## 2022-04-08 VITALS — BP 109/71 | HR 58 | Ht 65.0 in | Wt 120.2 lb

## 2022-04-08 DIAGNOSIS — N951 Menopausal and female climacteric states: Secondary | ICD-10-CM | POA: Diagnosis not present

## 2022-04-08 DIAGNOSIS — Z01419 Encounter for gynecological examination (general) (routine) without abnormal findings: Secondary | ICD-10-CM

## 2022-04-08 DIAGNOSIS — Z1231 Encounter for screening mammogram for malignant neoplasm of breast: Secondary | ICD-10-CM

## 2022-04-08 DIAGNOSIS — Z87898 Personal history of other specified conditions: Secondary | ICD-10-CM

## 2022-04-08 MED ORDER — METFORMIN HCL 500 MG PO TABS
500.0000 mg | ORAL_TABLET | Freq: Two times a day (BID) | ORAL | 5 refills | Status: DC
Start: 2022-04-08 — End: 2023-03-29

## 2022-04-09 ENCOUNTER — Other Ambulatory Visit: Payer: Self-pay | Admitting: Obstetrics and Gynecology

## 2022-04-24 IMAGING — MG DIGITAL SCREENING BILAT W/ TOMO W/ CAD
8 series · 9 of 24 positions shown · non-contrast
Comparison: Previous exam(s).

CLINICAL DATA: Screening.

EXAM:
DIGITAL SCREENING BILATERAL MAMMOGRAM WITH TOMO AND CAD

[R CC synth-2D]
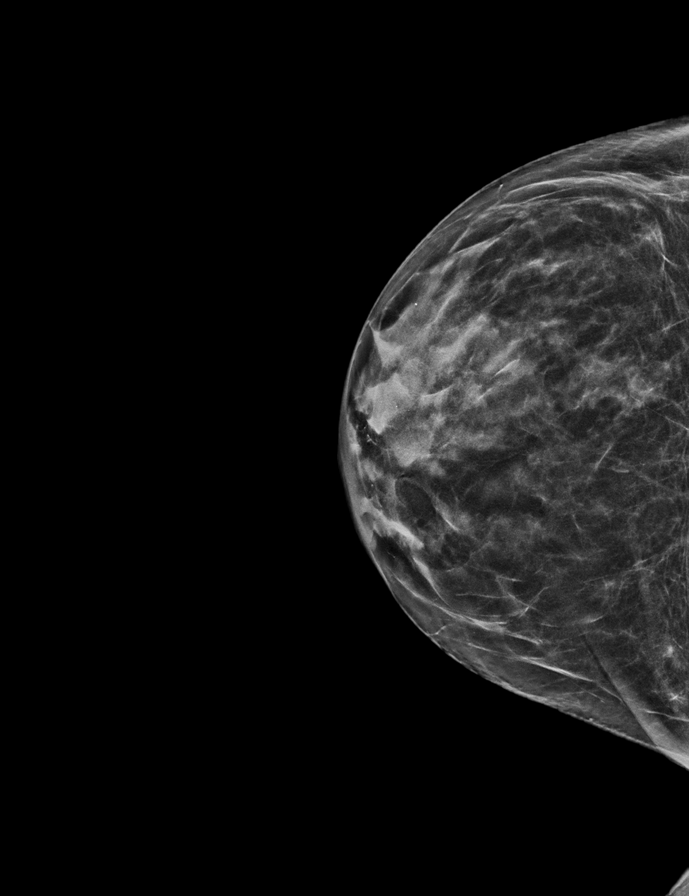

[L CC synth-2D]
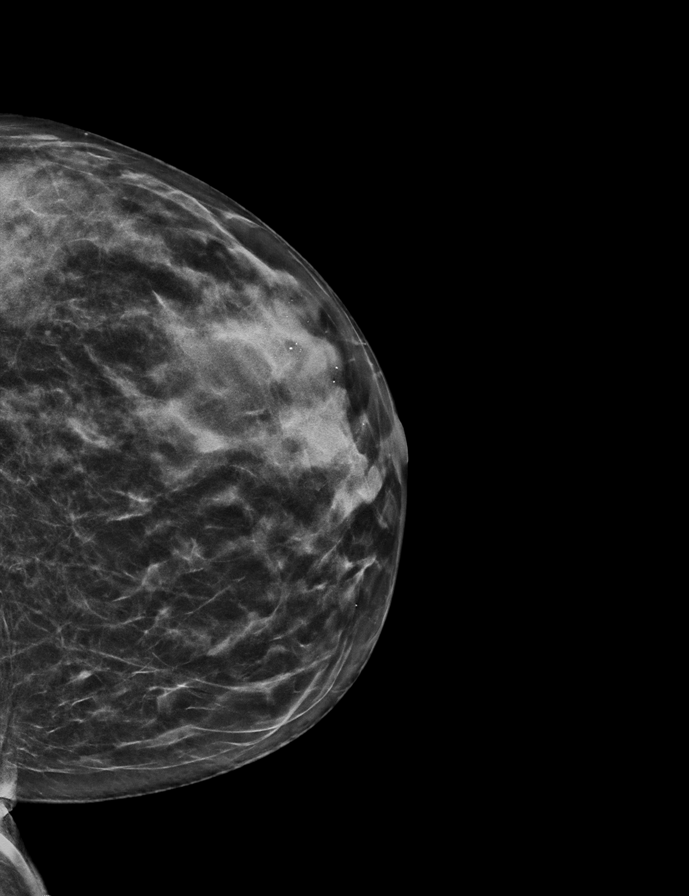

[L MLO synth-2D]
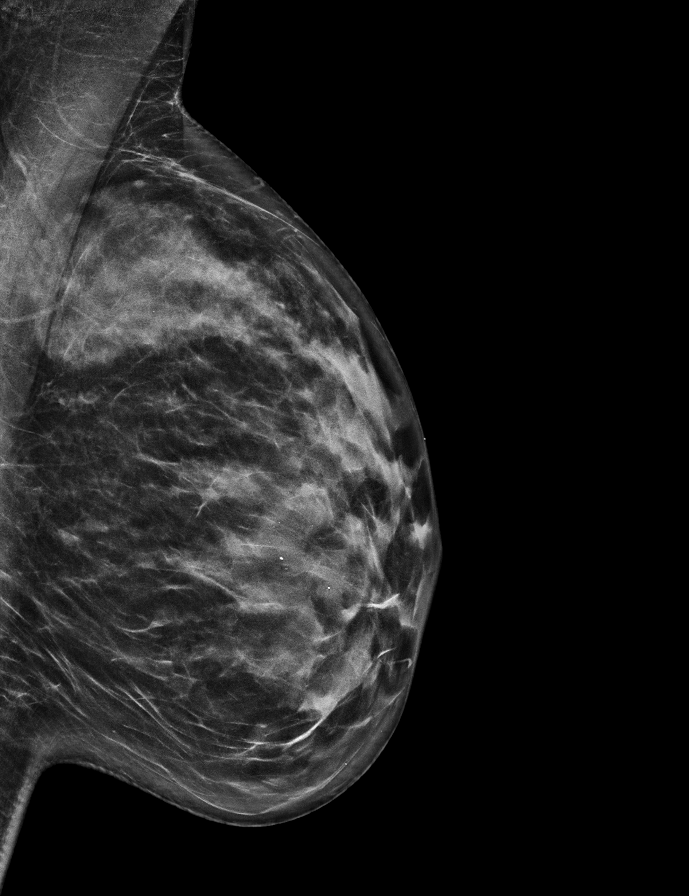

[R MLO synth-2D]
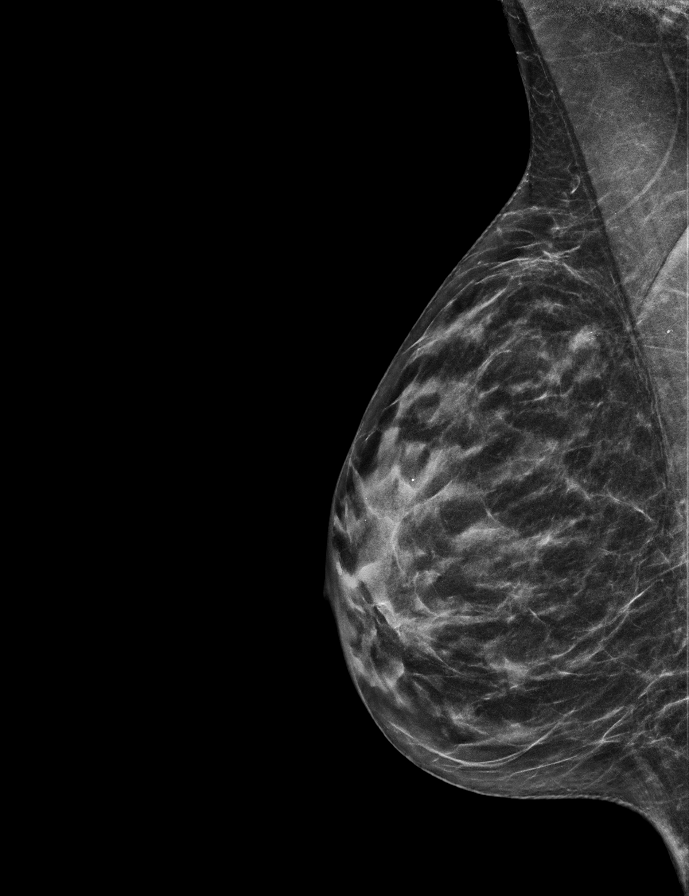

[L CC tomo · 2 of 51 frames shown]
[frame 17/51]
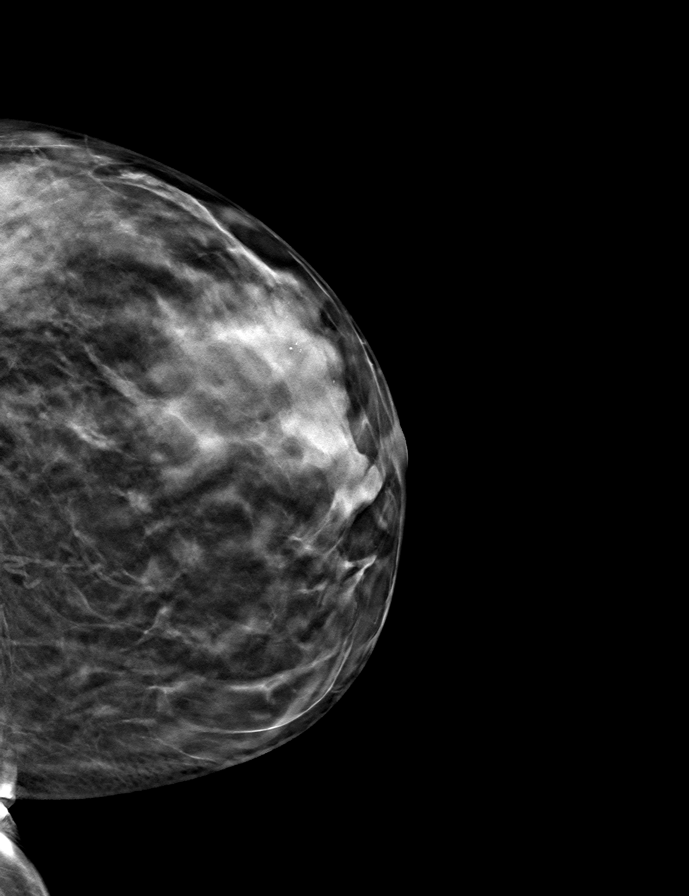
[frame 26/51]
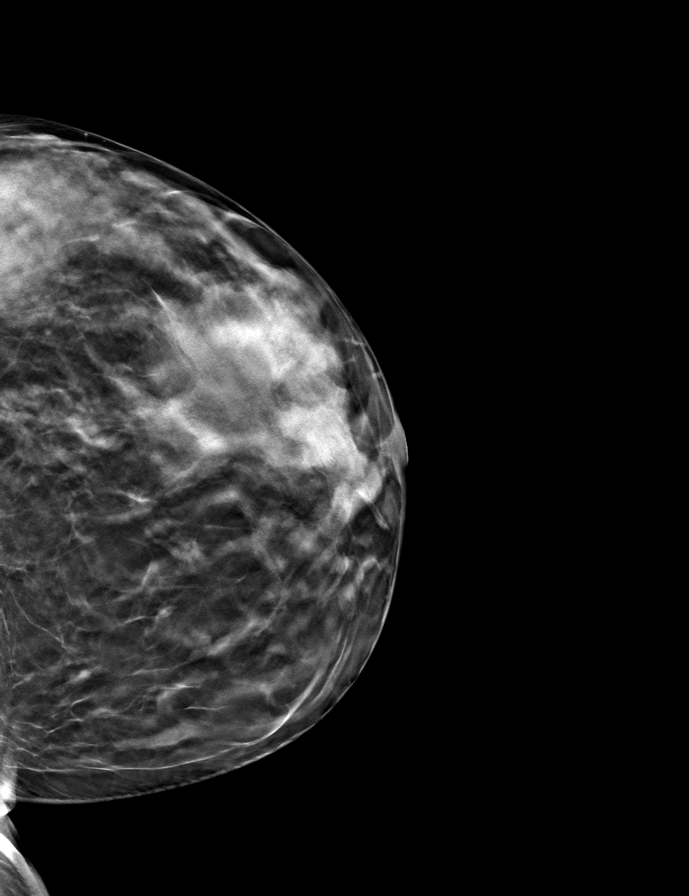

[L MLO tomo · tomo slice 31/61.0]
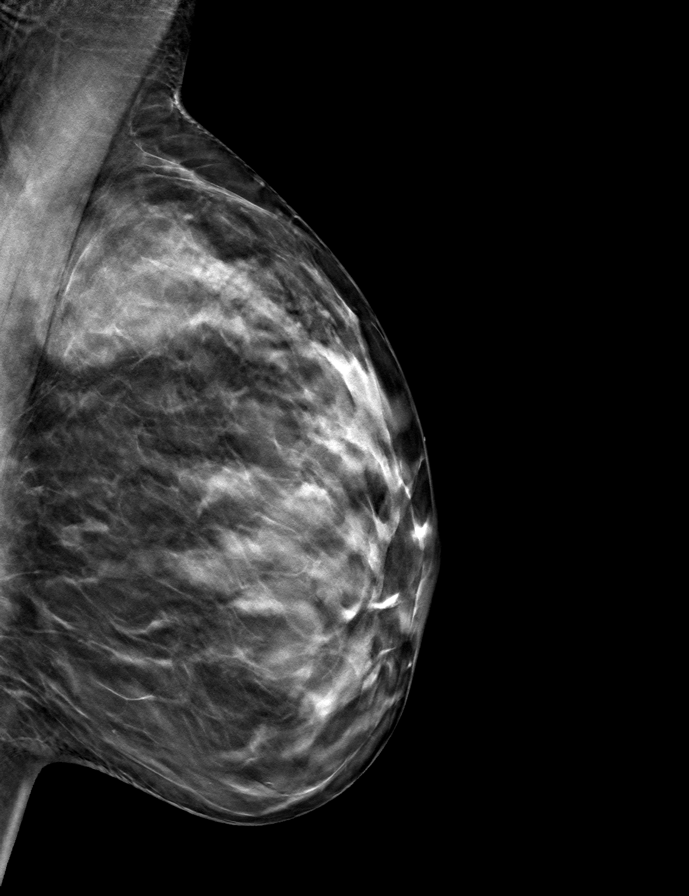

[R CC tomo · tomo slice 26/51.0]
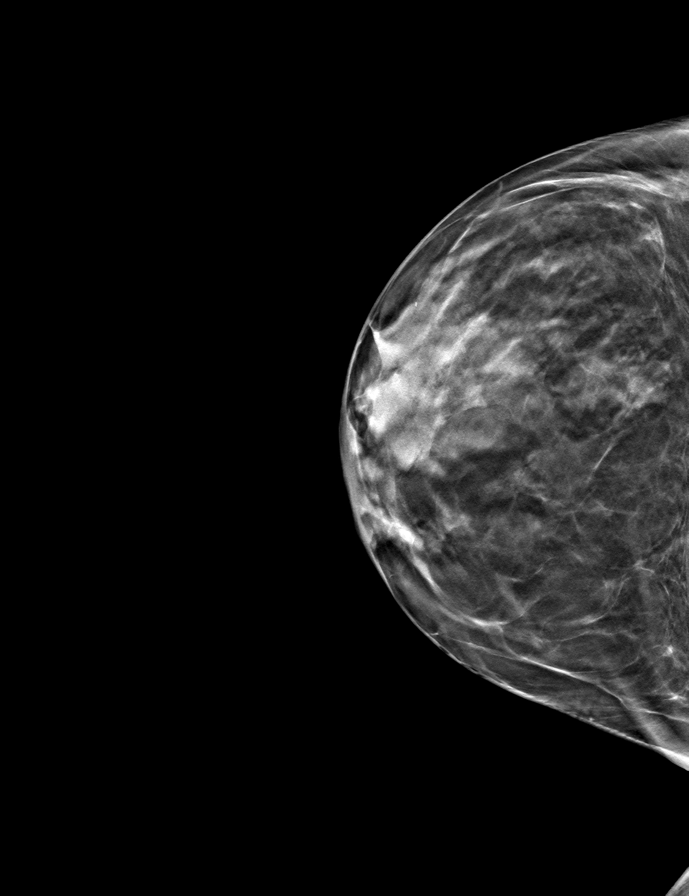

[R MLO tomo · tomo slice 25/49.0]
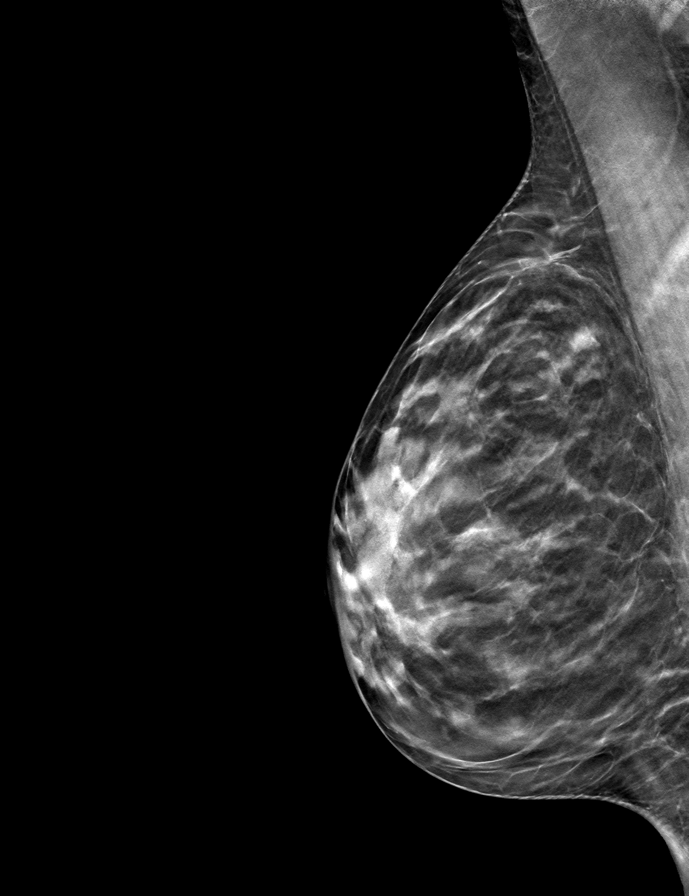

[9 of 24 positions shown; findings below may reference images not displayed]

ACR Breast Density Category c: The breast tissue is heterogeneously
dense, which may obscure small masses.
FINDINGS: In the right breast, a possible asymmetry with associated
microcalcifications warrants further evaluation. In the left breast,
no findings suspicious for malignancy. Images were processed with
CAD.
IMPRESSION: Further evaluation is suggested for possible asymmetry with
associated microcalcifications in the right breast.

RECOMMENDATION:
Diagnostic mammogram and possibly ultrasound of the right breast.
(Code:[id number])

The patient will be contacted regarding the findings, and additional
imaging will be scheduled.

BI-RADS CATEGORY  0: Incomplete. Need additional imaging evaluation
and/or prior mammograms for comparison.

## 2022-05-01 ENCOUNTER — Encounter: Payer: Self-pay | Admitting: Obstetrics and Gynecology

## 2022-05-11 ENCOUNTER — Other Ambulatory Visit: Payer: Self-pay | Admitting: Obstetrics and Gynecology

## 2022-05-11 ENCOUNTER — Ambulatory Visit
Admission: RE | Admit: 2022-05-11 | Discharge: 2022-05-11 | Disposition: A | Discharge status: Home / Self Care | Payer: BC Managed Care – PPO | Source: Ambulatory Visit | Attending: Obstetrics and Gynecology | Admitting: Obstetrics and Gynecology

## 2022-05-11 ENCOUNTER — Telehealth: Payer: Self-pay

## 2022-05-11 DIAGNOSIS — Z1231 Encounter for screening mammogram for malignant neoplasm of breast: Secondary | ICD-10-CM | POA: Diagnosis present

## 2022-05-11 NOTE — Telephone Encounter (Signed)
This is fine 

## 2022-05-11 NOTE — Telephone Encounter (Signed)
[facility] called for verbal order for breast u/s. They placed order, just needed a verbal ok. Ordered was ok'd by me and routed to Dr. Marcelline Mates for approval.

## 2023-03-29 ENCOUNTER — Other Ambulatory Visit: Payer: Self-pay | Admitting: Obstetrics and Gynecology

## 2023-03-29 ENCOUNTER — Other Ambulatory Visit: Payer: Self-pay

## 2023-03-29 DIAGNOSIS — Z01419 Encounter for gynecological examination (general) (routine) without abnormal findings: Secondary | ICD-10-CM

## 2023-03-29 DIAGNOSIS — E039 Hypothyroidism, unspecified: Secondary | ICD-10-CM

## 2023-03-29 MED ORDER — METFORMIN HCL 500 MG PO TABS: 500.0000 mg | ORAL_TABLET | Freq: Two times a day (BID) | ORAL | 0 refills | Status: DC

## 2023-05-03 NOTE — Progress Notes (Addendum)
ANNUAL PREVENTATIVE CARE GYNECOLOGY  ENCOUNTER NOTE  Subjective:       Tracy Gillespie is a 52 y.o. [phone] female here for a routine annual gynecologic exam. The patient is sexually active. The patient is not taking hormone replacement therapy. Patient denies post-menopausal vaginal bleeding. The patient wears seatbelts: yes. The patient participates in regular exercise: yes. Has the patient ever been transfused or tattooed?: no. The patient reports that there is not domestic violence in her life.  Current complaints: 1.  Has questions regarding if she is in, or close to menopause. Notes that she has not had a full cycle since November 2023, however on occasions she has noted an episode of spotting with wiping (last episodes in January and February).  Unsure if this was an attempt at a menstrua cycle, or if she was dealing with something urinary in nature. Notes on occasion feeling hot flushes, however they are not bad.  2. Desires a refill on her Metformin.    Gynecologic History No LMP recorded (lmp unknown). Contraception: none Last Pap: 04/02/2021. Results were: normal Last mammogram: 05/11/2022. Results were: normal Last Colonoscopy: FOBT 04/08/2022.    Obstetric History OB History  Gravida Para Term Preterm AB Living  6 2 2   4 2   SAB IAB Ectopic Multiple Live Births  1     0 2    # Outcome Date GA Lbr Len/2nd Weight Sex Type Anes PTL Lv  6 Term 11/23/20 [redacted]w[redacted]d 06:17 / 00:41 7 lb 5.1 oz (3.32 kg) F Vag-Spont None  LIV  5 SAB 11/2018 [redacted]w[redacted]d         4 AB 09/2016          3 AB 08/2014          2 Term 11/14/12 [redacted]w[redacted]d   M Vag-Spont  N LIV  1 AB             Obstetric Comments  G2 - 2014  no problems during labor, retained placenta postpartum (had D&C 10 weeks postpartum).  Did have hypothyroidism.   G4 - 09/2016 has miscarriage of twins (6 weeks, but not diagnosed until 11 weeks).      Past Medical History:  Diagnosis Date   Hypothyroidism    Miscarriage    Seasonal  allergies    Thyroid disease     Family History  Problem Relation Age of Onset   Breast cancer Neg Hx    Ovarian cancer Neg Hx    Stroke Neg Hx    Osteoarthritis Neg Hx     Past Surgical History:  Procedure Laterality Date   DILATION AND EVACUATION N/A 12/09/2016   Procedure: DILATATION AND EVACUATION;  Surgeon: Christeen Douglas, MD;  Location: [facility] ORS;  Service: Gynecology;  Laterality: N/A;   TONSILLECTOMY      Social History   Socioeconomic History   Marital status: Significant Other    Spouse name: Not on file   Number of children: Not on file   Years of education: Not on file   Highest education level: Not on file  Occupational History   Occupation: Retired  Tobacco Use   Smoking status: Never   Smokeless tobacco: Never  Vaping Use   Vaping status: Never Used  Substance and Sexual Activity   Alcohol use: No   Drug use: No   Sexual activity: Not Currently    Birth control/protection: None  Other Topics Concern   Not on file  Social History Narrative   Not on  file   Social Determinants of Health   Financial Resource Strain: Low Risk  (03/26/2023)   Received [facility]   Overall Financial Resource Strain (CARDIA)    Difficulty of Paying Living Expenses: Not hard at all  Food Insecurity: No Food Insecurity (03/26/2023)   Received from [facility]   Hunger Vital Sign    Worried About Running Out of Food in the Last Year: Never true    Ran Out of Food in the Last Year: Never true  Transportation Needs: No Transportation Needs (03/26/2023)   Received from [facility] - Transportation    In the past 12 months, has lack of transportation kept you from medical appointments or from getting medications?: No    Lack of Transportation (Non-Medical): No  Physical Activity: Not on file  Stress: Not on file  Social Connections: Not on file  Intimate Partner Violence: Not on file    Current Outpatient  Medications on File Prior to Visit  Medication Sig Dispense Refill   azelastine (ASTELIN) 0.1 % nasal spray Place into both nostrils 2 (two) times daily. Use in each nostril as directed     fluticasone (FLONASE) 50 MCG/ACT nasal spray Place into both nostrils daily.     levothyroxine (SYNTHROID) 88 MCG tablet Take 88 mcg by mouth daily before breakfast.     metFORMIN (GLUCOPHAGE) 500 MG tablet TAKE 1 TABLET(500 MG) BY MOUTH TWICE DAILY WITH A MEAL 180 tablet 3   Prenatal Vit-Fe Fumarate-FA (MULTIVITAMIN-PRENATAL) 27-0.8 MG TABS tablet Take 1 tablet by mouth daily at 12 noon.     No current facility-administered medications on file prior to visit.    No Known Allergies    Review of Systems ROS Review of Systems - General ROS: negative for - chills, fatigue, fever, hot flashes, night sweats, weight gain or weight loss Psychological ROS: negative for - anxiety, decreased libido, depression, mood swings, physical abuse or sexual abuse Ophthalmic ROS: negative for - blurry vision, eye pain or loss of vision ENT ROS: negative for - headaches, hearing change, visual changes or vocal changes Allergy and Immunology ROS: negative for - hives, itchy/watery eyes or seasonal allergies Hematological and Lymphatic ROS: negative for - bleeding problems, bruising, swollen lymph nodes or weight loss Endocrine ROS: negative for - galactorrhea, hair pattern changes, hot flashes, malaise/lethargy, mood swings, palpitations, polydipsia/polyuria, skin changes, temperature intolerance or unexpected weight changes Breast ROS: negative for - new or changing breast lumps or nipple discharge Respiratory ROS: negative for - cough or shortness of breath Cardiovascular ROS: negative for - chest pain, irregular heartbeat, palpitations or shortness of breath Gastrointestinal ROS: no abdominal pain, change in bowel habits, or black or bloody stools Genito-Urinary ROS: no dysuria, trouble voiding, or  hematuria Musculoskeletal ROS: negative for - joint pain or joint stiffness Neurological ROS: negative for - bowel and bladder control changes Dermatological ROS: negative for rash and skin lesion changes   Objective:   BP (!) 108/42   Pulse 73   Ht 5\' 5"  (1.651 m)   Wt 124 lb (56.2 kg)   LMP  (LMP Unknown)   BMI 20.63 kg/m  CONSTITUTIONAL: Well-developed, well-nourished female in no acute distress.  PSYCHIATRIC: Normal mood and affect. Normal behavior. Normal judgment and thought content. NEUROLGIC: Alert and oriented to person, place, and time. Normal muscle tone coordination. No cranial nerve deficit noted. HENT:  Normocephalic, atraumatic, External right and left ear normal. Oropharynx is clear and  moist EYES: Conjunctivae and EOM are normal. Pupils are equal, round, and reactive to light. No scleral icterus.  NECK: Normal range of motion, supple, no masses.  Normal thyroid.  SKIN: Skin is warm and dry. No rash noted. Not diaphoretic. No erythema. No pallor. CARDIOVASCULAR: Normal heart rate noted, regular rhythm, no murmur. RESPIRATORY: Clear to auscultation bilaterally. Effort and breath sounds normal, no problems with respiration noted. BREASTS: Symmetric in size. No masses, skin changes, nipple drainage, or lymphadenopathy. Inverted nipples bilaterally.  ABDOMEN: Soft, normal bowel sounds, no distention noted.  No tenderness, rebound or guarding.  BLADDER: Normal PELVIC:  Bladder no bladder distension noted  Urethra: normal appearing urethra with no masses, tenderness or lesions  Vulva: normal appearing vulva with no masses, tenderness or lesions  Vagina: normal appearing vagina with normal color and discharge, no lesions  Cervix: normal appearing cervix without discharge or lesions  Uterus: uterus is normal size, shape, consistency and nontender  Adnexa: normal adnexa in size, nontender and no masses  RV: External Exam NormaI and Normal Sphincter tone  MUSCULOSKELETAL:  Normal range of motion. No tenderness.  No cyanosis, clubbing, or edema.  2+ distal pulses. LYMPHATIC: No Axillary, Supraclavicular, or Inguinal Adenopathy.   Labs: Reviewed in Care Everywhere  Assessment:   1. Encounter for well woman exam with routine gynecological exam   2. Breast cancer screening by mammogram   3. Screening for diabetes mellitus (DM)   4. Hypothyroidism, unspecified type   5. Pure hypercholesterolemia   6. PA (pernicious anemia)      Plan:  Pap:  Up to date.  Mammogram: Ordered Colon Screening:   Up to date. Also discussed option of Cologuard for screening purposes.  Labs:  Reviewed in Care Everywhere Routine preventative health maintenance measures emphasized:  Self Breast Exams and Exercise/Diet/Weight control Flu vaccine status: Declines COVID Vaccination status: completed series, eligible for yearly "booster".  Discussed that she may indeed be perimenopausal. Offered hormone level assessment but patient declines.  Hyptohyroidism and hypercoholesterolemia managed by PCP/Endocrinologist.  Desires refill on Metformin, ahs remote h/o mild insulin resistance and also utilized for conception. Remains on medication as she feels it helps her stay regulated.  Return to Clinic - 1 Year   Hildred Laser, MD [facility]

## 2023-05-04 ENCOUNTER — Ambulatory Visit: Payer: BC Managed Care – PPO | Admitting: Obstetrics and Gynecology

## 2023-05-04 ENCOUNTER — Encounter: Payer: Self-pay | Admitting: Obstetrics and Gynecology

## 2023-05-04 VITALS — BP 124/63 | HR 73 | Ht 65.0 in | Wt 124.0 lb

## 2023-05-04 DIAGNOSIS — Z131 Encounter for screening for diabetes mellitus: Secondary | ICD-10-CM

## 2023-05-04 DIAGNOSIS — E7800 Pure hypercholesterolemia, unspecified: Secondary | ICD-10-CM

## 2023-05-04 DIAGNOSIS — Z01419 Encounter for gynecological examination (general) (routine) without abnormal findings: Secondary | ICD-10-CM

## 2023-05-04 DIAGNOSIS — E039 Hypothyroidism, unspecified: Secondary | ICD-10-CM

## 2023-05-04 DIAGNOSIS — D510 Vitamin B12 deficiency anemia due to intrinsic factor deficiency: Secondary | ICD-10-CM

## 2023-05-04 DIAGNOSIS — N951 Menopausal and female climacteric states: Secondary | ICD-10-CM

## 2023-05-04 DIAGNOSIS — Z1231 Encounter for screening mammogram for malignant neoplasm of breast: Secondary | ICD-10-CM

## 2023-05-04 MED ORDER — METFORMIN HCL 500 MG PO TABS: 500.0000 mg | ORAL_TABLET | Freq: Two times a day (BID) | ORAL | 3 refills | Status: DC

## 2023-05-12 ENCOUNTER — Encounter: Payer: Self-pay | Admitting: Obstetrics and Gynecology

## 2023-05-12 NOTE — Telephone Encounter (Signed)
Please mail Cologuard pamphlet to patient.

## 2023-08-16 ENCOUNTER — Ambulatory Visit
Admission: RE | Admit: 2023-08-16 | Discharge: 2023-08-16 | Disposition: A | Discharge status: Home / Self Care | Payer: 59 | Source: Ambulatory Visit | Attending: Obstetrics and Gynecology | Admitting: Obstetrics and Gynecology

## 2023-08-16 DIAGNOSIS — Z1231 Encounter for screening mammogram for malignant neoplasm of breast: Secondary | ICD-10-CM | POA: Diagnosis not present

## 2023-08-16 DIAGNOSIS — Z01419 Encounter for gynecological examination (general) (routine) without abnormal findings: Secondary | ICD-10-CM | POA: Diagnosis present

## 2023-08-17 ENCOUNTER — Encounter: Payer: Self-pay | Admitting: Obstetrics and Gynecology

## 2023-09-02 ENCOUNTER — Ambulatory Visit: Payer: BC Managed Care – PPO | Admitting: Dermatology

## 2023-09-08 ENCOUNTER — Ambulatory Visit: Payer: BC Managed Care – PPO | Admitting: Dermatology

## 2023-10-12 ENCOUNTER — Encounter: Payer: Self-pay | Admitting: Dermatology

## 2023-10-12 ENCOUNTER — Ambulatory Visit: Payer: BC Managed Care – PPO | Admitting: Dermatology

## 2023-10-12 DIAGNOSIS — D229 Melanocytic nevi, unspecified: Secondary | ICD-10-CM

## 2023-10-12 DIAGNOSIS — W908XXA Exposure to other nonionizing radiation, initial encounter: Secondary | ICD-10-CM | POA: Diagnosis not present

## 2023-10-12 DIAGNOSIS — L578 Other skin changes due to chronic exposure to nonionizing radiation: Secondary | ICD-10-CM | POA: Diagnosis not present

## 2023-10-12 DIAGNOSIS — L814 Other melanin hyperpigmentation: Secondary | ICD-10-CM

## 2023-10-12 DIAGNOSIS — L820 Inflamed seborrheic keratosis: Secondary | ICD-10-CM | POA: Diagnosis not present

## 2023-10-12 DIAGNOSIS — R21 Rash and other nonspecific skin eruption: Secondary | ICD-10-CM

## 2023-10-12 DIAGNOSIS — Z1283 Encounter for screening for malignant neoplasm of skin: Secondary | ICD-10-CM | POA: Diagnosis not present

## 2023-10-12 DIAGNOSIS — L821 Other seborrheic keratosis: Secondary | ICD-10-CM

## 2023-10-12 DIAGNOSIS — Z872 Personal history of diseases of the skin and subcutaneous tissue: Secondary | ICD-10-CM

## 2023-10-12 DIAGNOSIS — D1801 Hemangioma of skin and subcutaneous tissue: Secondary | ICD-10-CM

## 2023-10-12 NOTE — Patient Instructions (Addendum)
 Cryotherapy Aftercare  Wash gently with soap and water everyday.   Apply Vaseline and Band-Aid daily until healed.     Melanoma ABCDEs  Melanoma is the most dangerous type of skin cancer, and is the leading cause of death from skin disease.  You are more likely to develop melanoma if you: Have light-colored skin, light-colored eyes, or red or blond hair Spend a lot of time in the sun Tan regularly, either outdoors or in a tanning bed Have had blistering sunburns, especially during childhood Have a close family member who has had a melanoma Have atypical moles or large birthmarks  Early detection of melanoma is key since treatment is typically straightforward and cure rates are extremely high if we catch it early.   The first sign of melanoma is often a change in a mole or a new dark spot.  The ABCDE system is a way of remembering the signs of melanoma.  A for asymmetry:  The two halves do not match. B for border:  The edges of the growth are irregular. C for color:  A mixture of colors are present instead of an even brown color. D for diameter:  Melanomas are usually (but not always) greater than 6mm - the size of a pencil eraser. E for evolution:  The spot keeps changing in size, shape, and color.  Please check your skin once per month between visits. You can use a small mirror in front and a large mirror behind you to keep an eye on the back side or your body.   If you see any new or changing lesions before your next follow-up, please call to schedule a visit.  Please continue daily skin protection including broad spectrum sunscreen SPF 30+ to sun-exposed areas, reapplying every 2 hours as needed when you're outdoors.        Recommend daily broad spectrum sunscreen SPF 30+ to sun-exposed areas, reapply every 2 hours as needed. Call for new or changing lesions.  Staying in the shade or wearing long sleeves, sun glasses (UVA+UVB protection) and wide brim hats (4-inch brim around  the entire circumference of the hat) are also recommended for sun protection.       Due to recent changes in healthcare laws, you may see results of your pathology and/or laboratory studies on MyChart before the doctors have had a chance to review them. We understand that in some cases there may be results that are confusing or concerning to you. Please understand that not all results are received at the same time and often the doctors may need to interpret multiple results in order to provide you with the best plan of care or course of treatment. Therefore, we ask that you please give [place] 2 business days to thoroughly review all your results before contacting the office for clarification. Should we see a critical lab result, you will be contacted sooner.   If You Need Anything After Your Visit  If you have any questions or concerns for your doctor, please call our main line at [phone] and press option 4 to reach your doctor's medical assistant. If no one answers, please leave a voicemail as directed and we will return your call as soon as possible. Messages left after 4 pm will be answered the following business day.   You may also send [place] a message via MyChart. We typically respond to MyChart messages within 1-2 business days.  For prescription refills, please ask your pharmacy to contact our office. Our fax  number is [phone].  If you have an urgent issue when the clinic is closed that cannot wait until the next business day, you can page your doctor at the number below.    Please note that while we do our best to be available for urgent issues outside of office hours, we are not available 24/7.   If you have an urgent issue and are unable to reach [place], you may choose to seek medical care at your doctor's office, retail clinic, urgent care center, or emergency room.  If you have a medical emergency, please immediately call 911 or go to the emergency department.  Pager Numbers  -  Dr. Gwen Pounds: [phone]  - Dr. Roseanne Reno: [phone]  - Dr. Katrinka Blazing: [phone]   In the event of inclement weather, please call our main line at [phone] for an update on the status of any delays or closures.  Dermatology Medication Tips: Please keep the boxes that topical medications come in in order to help keep track of the instructions about where and how to use these. Pharmacies typically print the medication instructions only on the boxes and not directly on the medication tubes.   If your medication is too expensive, please contact our office at [phone] option 4 or send [place] a message through MyChart.   We are unable to tell what your co-pay for medications will be in advance as this is different depending on your insurance coverage. However, we may be able to find a substitute medication at lower cost or fill out paperwork to get insurance to cover a needed medication.   If a prior authorization is required to get your medication covered by your insurance company, please allow [place] 1-2 business days to complete this process.  Drug prices often vary depending on where the prescription is filled and some pharmacies may offer cheaper prices.  The website www.goodrx.com contains coupons for medications through different pharmacies. The prices here do not account for what the cost may be with help from insurance (it may be cheaper with your insurance), but the website can give you the price if you did not use any insurance.  - You can print the associated coupon and take it with your prescription to the pharmacy.  - You may also stop by our office during regular business hours and pick up a GoodRx coupon card.  - If you need your prescription sent electronically to a different pharmacy, notify our office through [facility] or by phone at [phone] option 4.     Si Usted Necesita Algo Despus de Su Visita  Tambin puede enviarnos un mensaje a travs de Clinical cytogeneticist. Por  lo general respondemos a los mensajes de MyChart en el transcurso de 1 a 2 das hbiles.  Para renovar recetas, por favor pida a su farmacia que se ponga en contacto con nuestra oficina. Annie Sable de fax es [place] [phone].  Si tiene un asunto urgente cuando la clnica est cerrada y que no puede esperar hasta el siguiente da hbil, puede llamar/localizar a su doctor(a) al nmero que aparece a continuacin.   Por favor, tenga en cuenta que aunque hacemos todo lo posible para estar disponibles para asuntos urgentes fuera del horario de [place], no estamos disponibles las 24 horas del da, los 7 [address]  [address] de la [place].   Si tiene un problema urgente y no puede comunicarse con nosotros, puede optar por buscar atencin mdica  en el consultorio de su doctor(a), en una clnica privada, en un centro  de atencin urgente o en una sala de emergencias.  Si tiene Engineer, drilling, por favor llame inmediatamente al 911 o vaya a la sala de emergencias.  Nmeros de bper  - Dr. Gwen Pounds: [phone]  - Dra. Roseanne Reno: [phone]  - Dr. Katrinka Blazing: [phone]   En caso de inclemencias del tiempo, por favor llame a Lacy Duverney principal al [phone] para una actualizacin sobre el [place] de cualquier retraso o cierre.  Consejos para la medicacin en dermatologa: Por favor, guarde las cajas en las que vienen los medicamentos de uso tpico para ayudarle a seguir las instrucciones sobre dnde y cmo usarlos. Las farmacias generalmente imprimen las instrucciones del medicamento slo en las cajas y no directamente en los tubos del [place].   Si su medicamento es muy caro, por favor, pngase en contacto con Rolm Gala llamando al [phone] y presione la opcin 4 o envenos un mensaje a travs de Clinical cytogeneticist.   No podemos decirle cul ser su copago por los medicamentos por adelantado ya que esto es diferente dependiendo de la cobertura de su seguro. Sin embargo, es posible que podamos  encontrar un medicamento sustituto a Audiological scientist un formulario para que el seguro cubra el medicamento que se considera necesario.   Si se requiere una autorizacin previa para que su compaa de seguros [place] su medicamento, por favor permtanos de 1 a 2 das hbiles para completar [address]  Los precios de los medicamentos varan con frecuencia dependiendo del Environmental consultant de dnde se surte la receta y alguna farmacias pueden ofrecer precios ms baratos.  El sitio web www.goodrx.com tiene cupones para medicamentos de Health and safety inspector. Los precios aqu no tienen en cuenta lo que podra costar con la ayuda del seguro (puede ser ms barato con su seguro), pero el sitio web puede darle el precio si no utiliz Tourist information centre manager.  - Puede imprimir el cupn correspondiente y llevarlo con su receta a la farmacia.  - Tambin puede pasar por nuestra oficina durante el horario de atencin regular y Education officer, museum una tarjeta de cupones de GoodRx.  - Si necesita que su receta se enve electrnicamente a una farmacia diferente, informe a nuestra oficina a travs de MyChart de [place] o por telfono llamando al [phone] y presione la opcin 4.

## 2023-10-12 NOTE — Progress Notes (Signed)
   New Patient Visit   Subjective  Tracy Gillespie is a 53 y.o. female who presents for the following: Skin Cancer Screening and Full Body Skin Exam, no known hx of skin cancer, patient report hx of rashes on her skin.   The patient presents for Total-Body Skin Exam (TBSE) for skin cancer screening and mole check. The patient has spots, moles and lesions to be evaluated, some may be new or changing and the patient may have concern these could be cancer.  The following portions of the chart were reviewed this encounter and updated as appropriate: medications, allergies, medical history  Review of Systems:  No other skin or systemic complaints except as noted in HPI or Assessment and Plan.  Objective  Well appearing patient in no apparent distress; mood and affect are within normal limits.  A full examination was performed including scalp, head, eyes, ears, nose, lips, neck, chest, axillae, abdomen, back, buttocks, bilateral upper extremities, bilateral lower extremities, hands, feet, fingers, toes, fingernails, and toenails. All findings within normal limits unless otherwise noted below.   Relevant physical exam findings are noted in the Assessment and Plan.  left leg x 1, left mandible  x 2 (3) Stuck-on, waxy, tan-brown papules and plaques -- Discussed benign etiology and prognosis.      Assessment & Plan   SKIN CANCER SCREENING PERFORMED TODAY.  ACTINIC DAMAGE - Chronic condition, secondary to cumulative UV/sun exposure - diffuse scaly erythematous macules with underlying dyspigmentation - Recommend daily broad spectrum sunscreen SPF 30+ to sun-exposed areas, reapply every 2 hours as needed.  - Staying in the shade or wearing long sleeves, sun glasses (UVA+UVB protection) and wide brim hats (4-inch brim around the entire circumference of the hat) are also recommended for sun protection.  - Call for new or changing lesions.  LENTIGINES, SEBORRHEIC KERATOSES, HEMANGIOMAS -  Benign normal skin lesions - Benign-appearing - Call for any changes  MELANOCYTIC NEVI - Tan-brown and/or pink-flesh-colored symmetric macules and papules - Benign appearing on exam today - Observation - Call clinic for new or changing moles - Recommend daily use of broad spectrum spf 30+ sunscreen to sun-exposed areas.   HISTORY OF RASHES Clear skin today   INFLAMED SEBORRHEIC KERATOSIS (3) left leg x 1, left mandible  x 2 (3) Symptomatic, irritating, patient would like treated.   Photos taken  Destruction of lesion - left leg x 1, left mandible  x 2 (3) Complexity: simple   Destruction method: cryotherapy   Informed consent: discussed and consent obtained   Timeout:  patient name, date of birth, surgical site, and procedure verified Lesion destroyed using liquid nitrogen: Yes   Region frozen until ice ball extended beyond lesion: Yes   Outcome: patient tolerated procedure well with no complications   Post-procedure details: wound care instructions given   Additional details:  Prior to procedure, discussed risks of blister formation, small wound, skin dyspigmentation, or rare scar following cryotherapy. Recommend Vaseline ointment to treated areas while healing.   Return in about 1 year (around 10/11/2024) for TBSE .  IAngelique Holm, CMA, am acting as scribe for Armida Sans, MD .   Documentation: I have reviewed the above documentation for accuracy and completeness, and I agree with the above.  Armida Sans, MD

## 2023-12-04 ENCOUNTER — Ambulatory Visit
Admission: EM | Admit: 2023-12-04 | Discharge: 2023-12-04 | Disposition: A | Discharge status: Home / Self Care | Attending: Family Medicine | Admitting: Family Medicine

## 2023-12-04 DIAGNOSIS — R21 Rash and other nonspecific skin eruption: Secondary | ICD-10-CM | POA: Insufficient documentation

## 2023-12-04 LAB — GROUP A STREP BY PCR: Group A Strep by PCR: NOT DETECTED

## 2023-12-04 MED ORDER — TRIAMCINOLONE ACETONIDE 0.1 % EX OINT: 1.0000 | TOPICAL_OINTMENT | Freq: Two times a day (BID) | CUTANEOUS | 0 refills | Status: AC

## 2023-12-04 NOTE — ED Triage Notes (Addendum)
 Pt c/o rash in torso & back x1 day. Denies any new foods,lotions or soaps. States itchy & red.

## 2023-12-04 NOTE — Discharge Instructions (Signed)
 I will call you if your strep test is positive.  If negative, you can see your results in MyChart.    Stop by the pharmacy to pick up your hydrocortisone ointment.  Follow up with your primary care provider or return to the urgent care, if not improving.

## 2023-12-04 NOTE — ED Provider Notes (Signed)
 [facility]    CSN: [id number] Arrival date & time: 12/04/23  4098      History   Chief Complaint Chief Complaint  Patient presents with   Rash    HPI Tracy Gillespie is a 53 y.o. female.   HPI  Tracy Gillespie presents for rash on her torso and mid back that started yesterday.  She is having sweats and chills. No fever.  A couple weeks ago she had ear pain and sore throat but this has resolved.  Says she hasn't felt bad.  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   There is been no new products including soaps and detergents.  No eye irritation, difficulty breathing, nausea, vomiting or diarrhea.  Denies belly pain, joint pain and fever.  There has been no medication changes or new supplements.  Denies any new foods or drinks.      Past Medical History:  Diagnosis Date   Hypothyroidism    Miscarriage    Seasonal allergies    Thyroid  disease     Patient Active Problem List   Diagnosis Date Noted   History of abnormal mammogram 09/10/2020   Health care maintenance 02/26/2016   Allergic rhinitis 06/13/2014   Hypothyroid 02/04/2014   PA (pernicious anemia) 02/04/2014   Migraine 02/04/2014   History of depression 02/04/2014   Pure hypercholesterolemia 10/18/2013   Low back pain 10/18/2013   Headache 10/18/2013    Past Surgical History:  Procedure Laterality Date   DILATION AND EVACUATION N/A 12/09/2016   Procedure: DILATATION AND EVACUATION;  Surgeon: Prescilla Brod, MD;  Location: [facility] ORS;  Service: Gynecology;  Laterality: N/A;   TONSILLECTOMY      OB History     Gravida  6   Para  2   Term  2   Preterm      AB  4   Living  2      SAB  1   IAB      Ectopic      Multiple  0   Live Births  2        Obstetric Comments  G2 - 2014  no problems during labor, retained placenta postpartum (had D&C 10 weeks postpartum).  Did have hypothyroidism.  G4 - 09/2016 has miscarriage of twins (6 weeks, but not  diagnosed until 11 weeks).            Home Medications    Prior to Admission medications   Medication Sig Start Date End Date Taking? Authorizing Provider  azelastine (ASTELIN) 0.1 % nasal spray Place into both nostrils 2 (two) times daily. Use in each nostril as directed   Yes [provider]  fluticasone (FLONASE) 50 MCG/ACT nasal spray Place into both nostrils daily.   Yes [provider]  levothyroxine (SYNTHROID) 88 MCG tablet Take 88 mcg by mouth daily before breakfast.   Yes [provider]  metFORMIN  (GLUCOPHAGE ) 500 MG tablet Take 1 tablet (500 mg total) by mouth 2 (two) times daily with a meal. 05/04/23  Yes Teresa Fender, MD  Prenatal Vit-Fe Fumarate-FA (MULTIVITAMIN-PRENATAL) 27-0.8 MG TABS tablet Take 1 tablet by mouth daily at 12 noon.   Yes [provider]  triamcinolone ointment (KENALOG) 0.1 % Apply 1 Application topically 2 (two) times daily. 12/04/23  Yes Fidel Huddle, DO    Family History Family History  Problem Relation Age of Onset   Breast cancer Neg Hx    Ovarian cancer Neg  Hx    Stroke Neg Hx    Osteoarthritis Neg Hx     Social History Social History   Tobacco Use   Smoking status: Never   Smokeless tobacco: Never  Vaping Use   Vaping status: Never Used  Substance Use Topics   Alcohol use: No   Drug use: No     Allergies   Patient has no known allergies.   Review of Systems Review of Systems :negative unless otherwise stated in HPI.      Physical Exam Triage Vital Signs ED Triage Vitals  Encounter Vitals Group     BP      Systolic BP Percentile      Diastolic BP Percentile      Pulse      Resp      Temp      Temp src      SpO2      Weight      Height      Head Circumference      Peak Flow      Pain Score      Pain Loc      Pain Education      Exclude from Growth Chart    No data found.  Updated Vital Signs BP 123/76 (BP Location: Left Arm)   Pulse 68   Temp 98.6 F (37 C)  (Oral)   Resp 16   Ht 5\' 5"  (1.651 m)   Wt 54.4 kg   LMP  (LMP Unknown)   SpO2 98%   BMI 19.97 kg/m   Visual Acuity Right Eye Distance:   Left Eye Distance:   Bilateral Distance:    Right Eye Near:   Left Eye Near:    Bilateral Near:     Physical Exam  GEN: alert, well appearing female, in no acute distress  EYES: no scleral injection or discharge HENT: Moist mucous membranes, no tonsils visible, no erythema no oropharyngeal lesions, ulcers or petechiae, no nasal discharge, TMs normal bilaterally CV: regular rate RESP: no increased work of breathing NEURO: alert, moves all extremities appropriately SKIN: warm and dry; fine macular papular erythematous rash on abdomen shoulders and back, no excoriations   UC Treatments / Results  Labs (all labs ordered are listed, but only abnormal results are displayed) Labs Reviewed  GROUP A STREP BY PCR    EKG   Radiology No results found.  Procedures Procedures (including critical care time)  Medications Ordered in UC Medications - No data to display  Initial Impression / Assessment and Plan / UC Course  I have reviewed the triage vital signs and the nursing notes.  Pertinent labs & imaging results that were available during my care of the patient were reviewed by me and considered in my medical decision making (see chart for details).     Patient is a 53 y.o. femalewho presents for rash .  Overall, patient is well-appearing and well-hydrated.  Vital signs stable.  Yamilka is afebrile.  Exam concerning for viral exanthem versus contact dermatitis versus scarlet fever.  Strep PCR is negative.  Treat with steroid ointment as below.  Patient to return if symptoms are not improving after steroid 1 before 1 week. No sign of infection to suggest antibiotics or antifungals at this time.     Reviewed expectations regarding course of current medical issues.  All questions asked were answered.  Outlined signs and symptoms  indicating need for more acute intervention. Patient verbalized understanding. After Visit Summary given.  Final Clinical Impressions(s) / UC Diagnoses   Final diagnoses:  Rash     Discharge Instructions      I will call you if your strep test is positive.  If negative, you can see your results in MyChart.    Stop by the pharmacy to pick up your hydrocortisone ointment.  Follow up with your primary care provider or return to the urgent care, if not improving.     ED Prescriptions     Medication Sig Dispense Auth. Provider   triamcinolone ointment (KENALOG) 0.1 % Apply 1 Application topically 2 (two) times daily. 30 g Gayle Collard, DO      PDMP not reviewed this encounter.              Fidel Huddle, DO 12/04/23 1028

## 2024-02-25 ENCOUNTER — Other Ambulatory Visit: Payer: Self-pay

## 2024-02-25 DIAGNOSIS — Z01419 Encounter for gynecological examination (general) (routine) without abnormal findings: Secondary | ICD-10-CM

## 2024-02-25 DIAGNOSIS — E039 Hypothyroidism, unspecified: Secondary | ICD-10-CM

## 2024-02-25 MED ORDER — METFORMIN HCL 500 MG PO TABS
500.0000 mg | ORAL_TABLET | Freq: Two times a day (BID) | ORAL | 0 refills | Status: AC
Start: 2024-02-25 — End: ?

## 2024-05-16 ENCOUNTER — Ambulatory Visit: Admitting: Obstetrics

## 2024-05-23 ENCOUNTER — Ambulatory Visit: Admitting: Obstetrics

## 2024-06-27 ENCOUNTER — Ambulatory Visit: Admitting: Obstetrics
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
## 2024-08-08 ENCOUNTER — Ambulatory Visit: Admitting: Obstetrics

## 2024-08-08 DIAGNOSIS — Z1231 Encounter for screening mammogram for malignant neoplasm of breast: Secondary | ICD-10-CM

## 2024-08-08 DIAGNOSIS — Z124 Encounter for screening for malignant neoplasm of cervix: Secondary | ICD-10-CM

## 2024-08-08 DIAGNOSIS — Z01419 Encounter for gynecological examination (general) (routine) without abnormal findings: Secondary | ICD-10-CM

## 2024-08-25 ENCOUNTER — Encounter: Payer: Self-pay | Admitting: Obstetrics
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
## 2024-08-29 ENCOUNTER — Ambulatory Visit: Admitting: Obstetrics

## 2024-08-29 DIAGNOSIS — Z124 Encounter for screening for malignant neoplasm of cervix: Secondary | ICD-10-CM

## 2024-08-29 DIAGNOSIS — Z1231 Encounter for screening mammogram for malignant neoplasm of breast: Secondary | ICD-10-CM

## 2024-08-29 DIAGNOSIS — Z01419 Encounter for gynecological examination (general) (routine) without abnormal findings: Secondary | ICD-10-CM

## 2024-10-11 ENCOUNTER — Ambulatory Visit: Admitting: Dermatology
